# Patient Record
Sex: Female | Born: 1935 | Race: Black or African American | Hispanic: No | Marital: Single | Smoking: Former smoker
Health system: Southern US, Community
[De-identification: ages and names within clinical notes are randomized; demographics above are authoritative.]

## PROBLEM LIST (undated history)

## (undated) DIAGNOSIS — I10 Essential (primary) hypertension: Secondary | ICD-10-CM

## (undated) DIAGNOSIS — M109 Gout, unspecified: Secondary | ICD-10-CM

## (undated) DIAGNOSIS — I509 Heart failure, unspecified: Secondary | ICD-10-CM

## (undated) DIAGNOSIS — F411 Generalized anxiety disorder: Secondary | ICD-10-CM

## (undated) DIAGNOSIS — C3490 Malignant neoplasm of unspecified part of unspecified bronchus or lung: Secondary | ICD-10-CM

## (undated) DIAGNOSIS — D649 Anemia, unspecified: Secondary | ICD-10-CM

## (undated) DIAGNOSIS — I739 Peripheral vascular disease, unspecified: Secondary | ICD-10-CM

## (undated) DIAGNOSIS — K219 Gastro-esophageal reflux disease without esophagitis: Secondary | ICD-10-CM

## (undated) DIAGNOSIS — H409 Unspecified glaucoma: Secondary | ICD-10-CM

## (undated) DIAGNOSIS — R1310 Dysphagia, unspecified: Secondary | ICD-10-CM

## (undated) HISTORY — PX: OTHER SURGICAL HISTORY: SHX169

## (undated) HISTORY — DX: Malignant neoplasm of unspecified part of unspecified bronchus or lung: C34.90

---

## 2009-03-23 ENCOUNTER — Inpatient Hospital Stay (HOSPITAL_COMMUNITY): Admission: RE | Admit: 2009-03-23 | Discharge: 2009-03-27 | Payer: Self-pay | Admitting: Neurological Surgery

## 2009-03-26 ENCOUNTER — Ambulatory Visit: Payer: Self-pay | Admitting: Physical Medicine & Rehabilitation

## 2009-03-27 ENCOUNTER — Inpatient Hospital Stay (HOSPITAL_COMMUNITY)
Admission: RE | Admit: 2009-03-27 | Discharge: 2009-04-04 | Payer: Self-pay | Admitting: Physical Medicine & Rehabilitation

## 2009-03-31 ENCOUNTER — Ambulatory Visit: Payer: Self-pay | Admitting: Physical Medicine & Rehabilitation

## 2009-05-14 ENCOUNTER — Encounter: Admission: RE | Admit: 2009-05-14 | Discharge: 2009-05-14 | Payer: Self-pay | Admitting: Neurological Surgery

## 2009-05-22 ENCOUNTER — Encounter: Admission: RE | Admit: 2009-05-22 | Discharge: 2009-05-22 | Payer: Self-pay | Admitting: Neurological Surgery

## 2009-07-24 ENCOUNTER — Encounter: Admission: RE | Admit: 2009-07-24 | Discharge: 2009-07-24 | Payer: Self-pay | Admitting: Neurological Surgery

## 2009-09-25 ENCOUNTER — Encounter: Admission: RE | Admit: 2009-09-25 | Discharge: 2009-09-25 | Payer: Self-pay | Admitting: Neurological Surgery

## 2011-02-03 LAB — CBC
HCT: 23.4 % — ABNORMAL LOW (ref 36.0–46.0)
HCT: 26 % — ABNORMAL LOW (ref 36.0–46.0)
Hemoglobin: 8.4 g/dL — ABNORMAL LOW (ref 12.0–15.0)
MCHC: 34 g/dL (ref 30.0–36.0)
MCV: 90.7 fL (ref 78.0–100.0)
Platelets: 170 10*3/uL (ref 150–400)
Platelets: 226 10*3/uL (ref 150–400)
Platelets: 293 10*3/uL (ref 150–400)
RBC: 2.59 MIL/uL — ABNORMAL LOW (ref 3.87–5.11)
RDW: 13.7 % (ref 11.5–15.5)
RDW: 13.9 % (ref 11.5–15.5)
WBC: 4.4 10*3/uL (ref 4.0–10.5)
WBC: 6.7 10*3/uL (ref 4.0–10.5)

## 2011-02-03 LAB — COMPREHENSIVE METABOLIC PANEL
AST: 10 U/L (ref 0–37)
Albumin: 2.3 g/dL — ABNORMAL LOW (ref 3.5–5.2)
Alkaline Phosphatase: 41 U/L (ref 39–117)
CO2: 27 mEq/L (ref 19–32)
Chloride: 104 mEq/L (ref 96–112)
Creatinine, Ser: 1.17 mg/dL (ref 0.4–1.2)
GFR calc Af Amer: 55 mL/min — ABNORMAL LOW (ref >60)
GFR calc non Af Amer: 45 mL/min — ABNORMAL LOW (ref >60)
Potassium: 4.1 mEq/L (ref 3.5–5.1)
Total Bilirubin: 0.4 mg/dL (ref 0.3–1.2)

## 2011-02-03 LAB — HEMOGLOBIN AND HEMATOCRIT, BLOOD
HCT: 26 % — ABNORMAL LOW (ref 36.0–46.0)
Hemoglobin: 8.8 g/dL — ABNORMAL LOW (ref 12.0–15.0)

## 2011-02-03 LAB — DIFFERENTIAL
Basophils Absolute: 0 10*3/uL (ref 0.0–0.1)
Basophils Relative: 0 % (ref 0–1)
Eosinophils Absolute: 0.2 10*3/uL (ref 0.0–0.7)
Eosinophils Relative: 4 % (ref 0–5)
Lymphocytes Relative: 26 % (ref 12–46)
Monocytes Absolute: 0.7 10*3/uL (ref 0.1–1.0)

## 2011-02-03 LAB — SEDIMENTATION RATE: Sed Rate: 73 mm/hr — ABNORMAL HIGH (ref 0–22)

## 2011-02-03 LAB — RHEUMATOID FACTOR: Rhuematoid fact SerPl-aCnc: <20 IU/mL (ref 0–20)

## 2011-02-04 LAB — DIFFERENTIAL
Basophils Relative: 0 % (ref 0–1)
Eosinophils Absolute: 0.2 10*3/uL (ref 0.0–0.7)
Eosinophils Relative: 4 % (ref 0–5)
Lymphs Abs: 1.5 10*3/uL (ref 0.7–4.0)
Monocytes Relative: 8 % (ref 3–12)

## 2011-02-04 LAB — COMPREHENSIVE METABOLIC PANEL
ALT: 12 U/L (ref 0–35)
AST: 18 U/L (ref 0–37)
Alkaline Phosphatase: 61 U/L (ref 39–117)
CO2: 27 mEq/L (ref 19–32)
Calcium: 9.7 mg/dL (ref 8.4–10.5)
GFR calc Af Amer: 55 mL/min — ABNORMAL LOW (ref >60)
GFR calc non Af Amer: 45 mL/min — ABNORMAL LOW (ref >60)
Glucose, Bld: 108 mg/dL — ABNORMAL HIGH (ref 70–99)
Potassium: 4.2 mEq/L (ref 3.5–5.1)
Sodium: 140 mEq/L (ref 135–145)
Total Protein: 6.7 g/dL (ref 6.0–8.3)

## 2011-02-04 LAB — URINALYSIS, ROUTINE W REFLEX MICROSCOPIC
Glucose, UA: NEGATIVE mg/dL
Hgb urine dipstick: NEGATIVE
Specific Gravity, Urine: 1.021 (ref 1.005–1.030)

## 2011-02-04 LAB — CBC
Hemoglobin: 10.2 g/dL — ABNORMAL LOW (ref 12.0–15.0)
MCHC: 33.8 g/dL (ref 30.0–36.0)
RBC: 3.36 MIL/uL — ABNORMAL LOW (ref 3.87–5.11)

## 2011-02-04 LAB — URINE MICROSCOPIC-ADD ON

## 2011-02-04 LAB — URINE CULTURE

## 2011-03-11 NOTE — H&P (Signed)
NAMEFRANCHON, Elizabeth Daniels NO.:  [id number]   MEDICAL RECORD NO.:  [id number]          PATIENT TYPE:  INP   LOCATION:                               FACILITY:  [facility]   PHYSICIAN:  Ranelle Oyster, M.D.DATE OF BIRTH:  [birth year]   DATE OF ADMISSION:  03/27/2009  DATE OF DISCHARGE:                              HISTORY & PHYSICAL   CHIEF COMPLAINT:  Tetraplegia.   PRIMARY CARE PHYSICIAN:  Dr. Nelson Chimes in [place].   NEUROSURGEON:  Tia Alert, MD   HISTORY OF PRESENT ILLNESS:  This is a 77 year old black female admitted  on Mar 23, 2009, with progressive numbness and weakness in her upper  extremities.  MRI showed cervical spondylosis with stenosis myelopathy  at C3-C4 and C4-C5.  She underwent decompression laminectomy with  foraminotomies at these levels on Mar 23, 2009, by Dr. Yetta Barre.  She is  placed in soft collar for comfort.  Pain has been controlled with  Vicodin and Flexeril.  She continues to have significant weakness in all  4, particularly in shoulders bilaterally.  She has had some pain and  spasms as well.  The patient continues to struggle with her mobility and  self-care and was evaluated by rehab on Mar 26, 2009, and felt to be  appropriate candidate and ultimately was admitted today.   REVIEW OF SYSTEMS:  Notable for weakness, numbness, some constipation,  decreased appetite, spasm, and pain.  Other pertinent positives are  above and full reviews in the written H&P.   PAST MEDICAL HISTORY:  Positive for:  1. Hypertension.  2. CAD.  3. Glaucoma.  4. Decreased hearing.  5. Chronic anemia.  6. T&A.  7. Bilateral cataract surgery.  8. Bladder tack.  9. She has remote tobacco and alcohol history.   FAMILY HISTORY:  Positive for CAD.   SOCIAL HISTORY:  The patient lives with daughter and granddaughter.  Daughter has cerebral palsy, but still works.  Granddaughter works days.  She has one-level house with two steps to enter.   ALLERGIES:   QUININE, CODEINE, ASPIRIN.   HOME MEDICATIONS:  Plavix, labetalol, clonidine, Micardis, Norvasc,  Flexeril, Lasix, vitamin D, Caltrate, Enablex.   LABORATORY DATA:  Hemoglobin 10.2, white count 4.8, platelets 203.  Sodium 140, potassium 4.2, BUN 16, creatinine 1.17.   PHYSICAL EXAMINATION:  VITAL SIGNS:  Blood pressure 126/63, pulse 75,  respiratory rate 18, temperature 97.1.  GENERAL:  The patient is pleasant, alert and oriented x3.  She is in a  soft collar today.  HEENT:  Ear, nose, and throat exam is essentially unremarkable with fair  dentition.  Pink moist mucosa.  NECK:  Supple without JVD or lymphadenopathy.  Posterior neck incision  is clean and intact with minimal serosanguineous discharge.  CHEST:  Clear to auscultation bilaterally without wheezes, rales, or  rhonchi.  HEART:  Regular rate and rhythm without murmurs, rubs, or gallops.  ABDOMEN:  Soft, nontender.  Bowel sounds are positive.  SKIN:  As noted above and no other breakdown is seen.  NEUROLOGIC:  Notable for generally intact sensation in the arms and legs  per the  patient exam.  She may have some diminishment in upper  extremities but difficult to truly assess today.  Strength is notably  diminishing in the upper extremities with trace to one on the right  shoulder, 1-2/5 left shoulder, biceps and triceps 3/5, hand intrinsics  2+-3/5.  Lower extremity strength is 2/5 right hip to +3 in right knee,  2+-3/5 right ankle.  Left lower extremities 3/5 to 3+/5 proximal and  distal.  Reflexes are 1+ throughout.  Judgment, orientation, memory, and  mood were all appropriate.   POSTADMISSION PHYSICIAN EVALUATION:  1. Functional deficits secondary to cervical stenosis, myelopathy,      status post decompressive laminectomy and foraminotomies at C3      through C5.  The patient is postoperative day #4.  2. The patient admitted to receive collaborative interdisciplinary      care between the physiatrist, rehab nursing  staff, and therapy      team.  3. The patient's level of medical complexity and substantial therapy      needs in context of that medical necessity cannot be provided at a      lesser intensity of care.  4. The patient has experienced substantial functional loss from her      baseline.  Upon functional assessment at the time of preadmission      screening, the patient was min assist bed mobility, min assist      ambulation 120 feet with a rolling walker.  She had not been      assessed by OT at the time of the eval.  Within the last 12 hours,      the patient has begun ADLs with min assist upper body bathing and      dressing, supervision lower body bathing and dressing.  The patient      was independent prior to arrival and driving.  Judging by the      patient's diagnosis, physical exam, and functional history, she has      the potential for functional progress which will result in      measurable gains while in inpatient rehab.  These gains will be of      substantial and practical use upon discharge to home in      facilitating mobility and self-care.  5. Physiatrist will provide 24-hour management of medical needs as      well as oversight of the therapy plan/treatment and provide      guidance as appropriate regarding interaction of the two.  Medical      problem list and plan are listed below.  6. 24-hour rehab nursing will assist in management of the patient's      skin care needs as well as bowel and bladder, nutrition, medication      administration, safety awareness, and pain control with goals      modified independent.  7. PT will assess and treat for lower extremity strengthening, range      of motion, adaptive equipment, functional mobility, gait,      neuromuscular reeducation with goals modified independent.  8. OT will assess and treat for upper extremity use, adaptive      techniques, equipment, family education, safety awareness, and      goals for OT are  supervision to modified independent.  9. Case management and social worker will assess and treat for      psychosocial issues and discharge planning.  10.Team conferences will be held weekly to assess progress towards  goals and to determine barriers to discharge.  11.The patient has demonstrated sufficient medical stability and      exercise capacity to tolerate at least 3 hours of therapy per day      at least 5 days per week.  12.Estimated length of stay is approximately 1 week.  Prognosis is      good.   MEDICAL PROBLEM LIST AND PLAN:  1. Pain control:  We will change Flexeril to Robaxin as the patient      has hallucinations and excessive fatigue with Flexeril.  We will      use Vicodin for breakthrough pain as well.  2. Hypertension:  Currently controlled with labetalol, clonidine,      Benicar, and Norvasc.  Observe as the patient's mobility increases.  3. CAD:  Plavix.  The patient denies any shortness breath, chest pain      etc.  4. Chronic anemia:  Most recent hemoglobin 10.2.  We will check CBC in      the morning for any acute blood loss.  5. Constipation:  We will augment bowel regimen and encourage appetite      and intake.      Ranelle Oyster, M.D.  Electronically Signed     ZTS/MEDQ  D:  03/27/2009  T:  03/27/2009  Job:  [id number]   cc:   Tia Alert, MD  Dr. Nelson Chimes

## 2011-03-11 NOTE — Op Note (Signed)
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   PROCEDURES:  1. Decompressive cervical laminectomy and medial facetectomy and      foraminotomies C3-4 and C4-5 for decompression of central canal.  2. Posterolateral arthrodesis C3-C5 utilizing locally harvested      morselized autologous bone graft and Actifuse putty.  3. Segmental fixation of C3 to C5 utilizing the vertex lateral mass      system.   SURGEON:  Tia Alert, MD   ASSISTANT:  Kathaleen Maser. Pool, MD   ANESTHESIA:  General endotracheal.   COMPLICATIONS:  None apparent.   INDICATIONS FOR PROCEDURE:  Elizabeth Daniels is a 77 year old female who  presented with progressive numbness and weakness in her hands, weakness  in her shoulder girdle especially on the right, and difficulty with  gait.  She was found to be myelopathic on exam.  She had a very weak  shoulder girdle on the right side.  Hand grips were 4-/5, and her gait  was spastic.  She was hyporeflexic.  She had an MRI which showed  spondylosis with cervical spinal stenosis at C3-4 and C4-5 with no  change in the spinal cord about C4.  I recommended decompressive  cervical laminectomy with lateral mass plating.  She understood the  risks, benefits, expected outcome and wished to proceed.   DESCRIPTION OF THE PROCEDURE:  The patient was taken to the operating  room, and after induction of adequate generalized endotracheal  anesthesia she  was fixed to a three-point [place] headrest in the prone  position.  The posterior cervical region was prepped with DuraPrep and  then draped in the usual sterile fashion.  Local anesthesia 6 mL was  injected, and dorsal midline incision was made and carried down to the  fascia.  The cervical fascia was opened, and the paraspinous musculature  was taken down in a subperiosteal fashion to expose C3, C4, and C5.  I  dissected out over the lateral masses.  Intraoperative fluoroscopy  confirmed my level.  She was very spondylotic in her anatomy, was  distorted.  I localized my screw entry zones 1 mm medial to the midpoint  of the lateral mass.  I drilled in an upward and outward direction with  the hand drill and the drill guide into the safe zone of the lateral  mass, tapped each lateral mass, and then placed 14-mm lateral mass  screws in the lateral mass of C3, C4, and C5, and checked these with  lateral fluoroscopy.  I then removed the spinous processes of C3 and C4  in the top past the  C5 and then used the 2- and 3-mm Kerrison punch to  perform a complete laminectomy at C3, C4, and the top of C5.  Medial  facetectomies were also performed, and medial foraminotomies were  performed at C4-5.  Once my decompression was complete, I could see the  cord pulsatile through the dura.  I then took lordotic rods and placed  them into multiaxial screw heads of the vertex screws and locked them  into position, utilizing the anti-torque device.  We then checked our  construct with AP and lateral fluoroscopy.  I decorticated the lateral  masses as well as the facets and packed these with local autograft and  Actifuse putty.  We then placed a medium Hemovac drain through a  separate stab incision, lined the dura with Gelfoam, closed the muscle  and the fascia with 0 Vicryl closing the subcutaneous and subcuticular  tissue with 2-0 and 3-0 Vicryl and closed the skin with Benzoin and  Steri-Strips.  The  drapes were removed.  Sterile dressing was applied.  The patient was awakened from anesthesia and transferred to the recovery  room stable condition.  At the end of the procedure, all sponge, needle,  and instrument counts were correct.      Tia Alert, MD  Electronically Signed     DSJ/MEDQ  D:  03/23/2009  T:  03/23/2009  Job:  [id number]

## 2011-03-11 NOTE — Discharge Summary (Signed)
NAMECECILA, SATCHER NO.:  [id number]   MEDICAL RECORD NO.:  [id number]          PATIENT TYPE:  IPS   LOCATION:  4008                         FACILITY:  [facility]   PHYSICIAN:  Ranelle Oyster, M.D.DATE OF BIRTH:  06-Sep-1931   DATE OF ADMISSION:  03/27/2009  DATE OF DISCHARGE:  04/04/2009                               DISCHARGE SUMMARY   DISCHARGE DIAGNOSES:  1. Cervical stenosis with myelopathy.  2. Pain management.  3. Hypertension.  4. Coronary artery disease.  5. Chronic anemia.   PROCEDURE:  Status post decompressive laminectomy with foraminotomies,  cervical 3-4 and 4-5 on May 28 by Dr. Marikay Alar.   This is 77 year old female, admitted on May 28 with progressive numbness  and weakness in her hands and shoulders.  MRI and imaging showed  cervical spondylosis with stenosis and myelopathy, cervical 3-4 and 4-5,  underwent decompression laminectomy cervical 3-4 and 4-5 on May 28 by  Dr. Yetta Barre.  Soft cervical collar for comfort.  Pain management with  Vicodin and Flexeril, still some bilateral shoulder girdle weakness.  No  chest pain or shortness of breath.  The patient was seen by Inpatient  Rehab Services and felt to be a good candidate for comprehensive rehab  program.   PAST MEDICAL HISTORY:  See discharge diagnoses.  Remote smoker.  Remote  alcohol.   ALLERGIES:  QUININE, CODEINE, and ASPIRIN.   SOCIAL HISTORY:  Lives with daughter and granddaughter, daughter with  cerebral palsy, but still works, granddaughter works day shifts, 1-level  home, 2 steps to entry.   FUNCTIONAL HISTORY PRIOR TO ADMISSION:  Independent with a cane and  driving.   FUNCTIONAL STATUS UPON ADMISSION TO REHAB SERVICES:  Minimal assist  transfers, minimal assist ambulation 120 feet, minimal assist upper body  bathing and dressing, supervision lower body bathing and dressing.   MEDICATIONS PRIOR TO ADMISSION:  1. Plavix 75 mg daily.  2. Labetalol 200 mg one and half  tablets twice daily.  3. Clonidine 0.2 mg twice daily.  4. Micardis 80 mg daily.  5. Norvasc 5 mg daily.  6. Flexeril 10 mg one-half tablet 3 times daily.  7. Lasix 40 mg twice daily as needed.  8. Vitamin D daily.  9. Caltrate twice daily.  10.Enablex 7.5 mg daily.   PHYSICAL EXAMINATION:  VITAL SIGNS:  Blood pressure 126/63, pulse 75,  temperature 97.1, and respirations 18.  NEUROLOGIC:  This is an alert female, in no acute distress, oriented x3.  Sensation decreased to light touch distally.  Cervical collar in place.  Surgical site clean and dry.  Calves remained cool without swelling or  erythema, nontender.  LUNGS:  Clear to auscultation.  CARDIAC:  Regular rate and rhythm.  ABDOMEN:  Soft and nontender.  Good bowel sounds.   REHABILITATION HOSPITAL COURSE:  The patient was admitted to Inpatient  Rehab Services with therapies initiated on a 3-hour daily basis  consisting of physical therapy, occupational therapy, and rehabilitation  nursing.  The following issues were addressed during the patient's  rehabilitation stay.  Pertaining to Ms. Amor's cervical stenosis  myelopathy, she had undergone decompressive laminectomy  cervical 3-4 and  4-5 on May 28 by Dr. Marikay Alar.  She had a cervical collar in place  for comfort measures.  Pain management ongoing with the use of Vicodin  and Robaxin as needed.  Blood pressures were well controlled on  labetalol, clonidine, Benicar, Norvasc and no orthostatic changes.  She  would follow up with her primary MD, Dr. Nelson Chimes of [place], [place].  She had a history of coronary artery disease.  No stent.  She  was on Plavix prior to admission and this was resumed at discharge.  Chronic anemia remained stable with hemoglobin 8.8.  She was on iron  supplement.  The patient received weekly collaborative interdisciplinary  team conferences to discuss estimated length of stay, family teaching,  any barriers to discharge.  She was up  walking with a rolling walker,  extended household distances, needing some assistance yet for lower body  activities of daily living.  She lives with her daughter, who still  works, her granddaughter also home that worked.  Functionally, she  continued to progress in all areas and home health therapies would be  ongoing to help in managing her at home.  She had no bowel or bladder  disturbances through her rehab stay.  She did have urinalysis study  showed no growth.  She was discharged to home.   DISCHARGE MEDICATIONS:  1. Labetalol 300 mg twice daily.  2. Clonidine 0.2 mg twice daily.  3. Micardis 80 mg daily.  4. Norvasc 5 mg daily.  5. Vitamin D daily.  6. Nu-Iron 150 mg daily.  7. Enablex 7.5 mg daily.  8. Protonix 40 mg daily.  9. Carafate 1 g 3 times daily.  10.Vicodin 5/325 one or two tablets every 4 hours as needed for pain.  11.Robaxin 500 mg every 6 hours as needed for muscle spasms.  12.Plavix 75 mg daily, resumed.   DIET:  No restrictions.   SPECIAL INSTRUCTIONS:  Soft cervical collar for comfort .  Follow up  with Dr. Marikay Alar, Neurosurgery.  Call for appointment Dr. Nelson Chimes,  Medical Management.      Mariam Dollar, P.A.      Ranelle Oyster, M.D.  Electronically Signed    DA/MEDQ  D:  04/03/2009  T:  04/04/2009  Job:  [id number]   cc:   Dr. Bud Face, MD

## 2011-03-14 NOTE — Discharge Summary (Signed)
NAMEASHAYLA, SUBIA NO.:  [id number]   MEDICAL RECORD NO.:  [id number]          PATIENT TYPE:  INP   LOCATION:  3033                         FACILITY:  [facility]   PHYSICIAN:  Tia Alert, MD     DATE OF BIRTH:  May 16, 1931   DATE OF ADMISSION:  03/23/2009  DATE OF DISCHARGE:  04/05/2009                               DISCHARGE SUMMARY   ADMITTING DIAGNOSIS:  Cervical spondylotic myelopathy.   PROCEDURE:  Cervical laminectomy with instrumented fusion.   BRIEF HISTORY OF PRESENT ILLNESS:  Ms. Mutz is a 77 year old  female who presented with numbness in her hands and change in gait.  She  was found to be severely myelopathic.  On exam, she had stenosis at C3-4  and C4-5.  I recommended a posterior cervical decompression and  instrumented fusion.  She understood the risks, benefits, and expected  outcome and wished to proceed.   HOSPITAL COURSE:  The patient was admitted on Mar 23, 2009, and taken to  the operating room where she underwent a cervical laminectomy at C3-4  and C4-5, followed by instrumented fusion at C3-C5.  The patient  tolerated the procedure well and was taken to the recovery room and then  to the floor in stable condition.  For details of the operative  procedure, please see the dictated operative note.  The patient's  hospital course was fairly routine.  There were no complications.  Her  wound remained clean, dry, and intact.  Her Hemovac drain was removed on  postop day 2.  She had no real improvement in myelopathic symptom.  She  had some difficulty with gait and with weakness in her hands and in her  shoulder girdles.  We ordered physical and occupational therapy, we felt  like she was a good candidate for rehabilitation because of her severe  myelopathy.  She was discharged the rehab unit on March 27, 2009, with  plans to follow up with me in 2 weeks after discharge from rehab.   FINAL DIAGNOSIS:  Cervical spondylotic myelopathy,  status post cervical  laminectomy with instrumented fusion.      Tia Alert, MD  Electronically Signed     DSJ/MEDQ  D:  04/27/2009  T:  04/27/2009  Job:  [id number]

## 2015-09-30 ENCOUNTER — Encounter (HOSPITAL_COMMUNITY): Payer: Self-pay | Admitting: Emergency Medicine

## 2015-09-30 ENCOUNTER — Emergency Department (HOSPITAL_COMMUNITY): Payer: Medicare Other

## 2015-09-30 ENCOUNTER — Inpatient Hospital Stay (HOSPITAL_COMMUNITY)
Admission: EM | Admit: 2015-09-30 | Discharge: 2015-10-04 | DRG: 378 | Disposition: A | Discharge status: Skilled Nursing Facility | Payer: Medicare Other | Attending: Internal Medicine | Admitting: Internal Medicine

## 2015-09-30 DIAGNOSIS — N183 Chronic kidney disease, stage 3 (moderate): Secondary | ICD-10-CM | POA: Diagnosis present

## 2015-09-30 DIAGNOSIS — Z89611 Acquired absence of right leg above knee: Secondary | ICD-10-CM

## 2015-09-30 DIAGNOSIS — R55 Syncope and collapse: Secondary | ICD-10-CM | POA: Diagnosis present

## 2015-09-30 DIAGNOSIS — E440 Moderate protein-calorie malnutrition: Secondary | ICD-10-CM | POA: Diagnosis present

## 2015-09-30 DIAGNOSIS — K921 Melena: Secondary | ICD-10-CM | POA: Diagnosis not present

## 2015-09-30 DIAGNOSIS — M109 Gout, unspecified: Secondary | ICD-10-CM | POA: Diagnosis present

## 2015-09-30 DIAGNOSIS — R4189 Other symptoms and signs involving cognitive functions and awareness: Secondary | ICD-10-CM

## 2015-09-30 DIAGNOSIS — I739 Peripheral vascular disease, unspecified: Secondary | ICD-10-CM | POA: Diagnosis present

## 2015-09-30 DIAGNOSIS — D62 Acute posthemorrhagic anemia: Secondary | ICD-10-CM | POA: Diagnosis present

## 2015-09-30 DIAGNOSIS — Z79899 Other long term (current) drug therapy: Secondary | ICD-10-CM

## 2015-09-30 DIAGNOSIS — I10 Essential (primary) hypertension: Secondary | ICD-10-CM | POA: Diagnosis not present

## 2015-09-30 DIAGNOSIS — R001 Bradycardia, unspecified: Secondary | ICD-10-CM | POA: Diagnosis present

## 2015-09-30 DIAGNOSIS — Z9049 Acquired absence of other specified parts of digestive tract: Secondary | ICD-10-CM | POA: Diagnosis not present

## 2015-09-30 DIAGNOSIS — I509 Heart failure, unspecified: Secondary | ICD-10-CM | POA: Diagnosis present

## 2015-09-30 DIAGNOSIS — E861 Hypovolemia: Secondary | ICD-10-CM | POA: Diagnosis present

## 2015-09-30 DIAGNOSIS — Z7902 Long term (current) use of antithrombotics/antiplatelets: Secondary | ICD-10-CM | POA: Diagnosis not present

## 2015-09-30 DIAGNOSIS — Z6820 Body mass index (BMI) 20.0-20.9, adult: Secondary | ICD-10-CM

## 2015-09-30 DIAGNOSIS — I130 Hypertensive heart and chronic kidney disease with heart failure and stage 1 through stage 4 chronic kidney disease, or unspecified chronic kidney disease: Secondary | ICD-10-CM | POA: Diagnosis present

## 2015-09-30 DIAGNOSIS — R1310 Dysphagia, unspecified: Secondary | ICD-10-CM | POA: Diagnosis present

## 2015-09-30 DIAGNOSIS — K922 Gastrointestinal hemorrhage, unspecified: Secondary | ICD-10-CM | POA: Diagnosis present

## 2015-09-30 DIAGNOSIS — R579 Shock, unspecified: Secondary | ICD-10-CM | POA: Diagnosis not present

## 2015-09-30 DIAGNOSIS — F411 Generalized anxiety disorder: Secondary | ICD-10-CM | POA: Diagnosis present

## 2015-09-30 DIAGNOSIS — R933 Abnormal findings on diagnostic imaging of other parts of digestive tract: Secondary | ICD-10-CM | POA: Diagnosis not present

## 2015-09-30 DIAGNOSIS — N189 Chronic kidney disease, unspecified: Secondary | ICD-10-CM

## 2015-09-30 DIAGNOSIS — Z87891 Personal history of nicotine dependence: Secondary | ICD-10-CM | POA: Diagnosis not present

## 2015-09-30 DIAGNOSIS — H409 Unspecified glaucoma: Secondary | ICD-10-CM | POA: Diagnosis present

## 2015-09-30 DIAGNOSIS — K625 Hemorrhage of anus and rectum: Secondary | ICD-10-CM

## 2015-09-30 DIAGNOSIS — K5731 Diverticulosis of large intestine without perforation or abscess with bleeding: Secondary | ICD-10-CM | POA: Diagnosis present

## 2015-09-30 DIAGNOSIS — K219 Gastro-esophageal reflux disease without esophagitis: Secondary | ICD-10-CM | POA: Diagnosis present

## 2015-09-30 DIAGNOSIS — N179 Acute kidney failure, unspecified: Secondary | ICD-10-CM | POA: Diagnosis present

## 2015-09-30 HISTORY — DX: Heart failure, unspecified: I50.9

## 2015-09-30 HISTORY — DX: Gastro-esophageal reflux disease without esophagitis: K21.9

## 2015-09-30 HISTORY — DX: Generalized anxiety disorder: F41.1

## 2015-09-30 HISTORY — DX: Dysphagia, unspecified: R13.10

## 2015-09-30 HISTORY — DX: Peripheral vascular disease, unspecified: I73.9

## 2015-09-30 HISTORY — DX: Essential (primary) hypertension: I10

## 2015-09-30 HISTORY — DX: Anemia, unspecified: D64.9

## 2015-09-30 HISTORY — DX: Unspecified glaucoma: H40.9

## 2015-09-30 HISTORY — DX: Gout, unspecified: M10.9

## 2015-09-30 LAB — PREPARE RBC (CROSSMATCH)

## 2015-09-30 LAB — HEMOGLOBIN AND HEMATOCRIT, BLOOD
HEMATOCRIT: 35 % — AB (ref 36.0–46.0)
HEMATOCRIT: 32.6 % — AB (ref 36.0–46.0)
HEMOGLOBIN: 10.8 g/dL — AB (ref 12.0–15.0)
Hemoglobin: 11.6 g/dL — ABNORMAL LOW (ref 12.0–15.0)

## 2015-09-30 LAB — COMPREHENSIVE METABOLIC PANEL
ALT: 6 U/L — AB (ref 14–54)
AST: 11 U/L — AB (ref 15–41)
Albumin: 2.8 g/dL — ABNORMAL LOW (ref 3.5–5.0)
Alkaline Phosphatase: 57 U/L (ref 38–126)
Anion gap: 7 (ref 5–15)
BUN: 54 mg/dL — AB (ref 6–20)
CHLORIDE: 113 mmol/L — AB (ref 101–111)
CO2: 22 mmol/L (ref 22–32)
CREATININE: 1.55 mg/dL — AB (ref 0.44–1.00)
Calcium: 9.5 mg/dL (ref 8.9–10.3)
GFR calc Af Amer: 35 mL/min — ABNORMAL LOW (ref >60)
GFR, EST NON AFRICAN AMERICAN: 30 mL/min — AB (ref >60)
GLUCOSE: 95 mg/dL (ref 65–99)
Potassium: 4 mmol/L (ref 3.5–5.1)
Sodium: 142 mmol/L (ref 135–145)
Total Bilirubin: 0.4 mg/dL (ref 0.3–1.2)
Total Protein: 6 g/dL — ABNORMAL LOW (ref 6.5–8.1)

## 2015-09-30 LAB — CBC WITH DIFFERENTIAL/PLATELET
Basophils Absolute: 0 10*3/uL (ref 0.0–0.1)
Basophils Relative: 0 %
EOS ABS: 0.1 10*3/uL (ref 0.0–0.7)
EOS PCT: 3 %
HCT: 30.5 % — ABNORMAL LOW (ref 36.0–46.0)
Hemoglobin: 9.7 g/dL — ABNORMAL LOW (ref 12.0–15.0)
LYMPHS ABS: 1 10*3/uL (ref 0.7–4.0)
Lymphocytes Relative: 25 %
MCH: 32.2 pg (ref 26.0–34.0)
MCHC: 31.8 g/dL (ref 30.0–36.0)
MCV: 101.3 fL — ABNORMAL HIGH (ref 78.0–100.0)
MONO ABS: 0.3 10*3/uL (ref 0.1–1.0)
MONOS PCT: 7 %
Neutro Abs: 2.6 10*3/uL (ref 1.7–7.7)
Neutrophils Relative %: 65 %
PLATELETS: 200 10*3/uL (ref 150–400)
RBC: 3.01 MIL/uL — AB (ref 3.87–5.11)
RDW: 15 % (ref 11.5–15.5)
WBC: 3.9 10*3/uL — AB (ref 4.0–10.5)

## 2015-09-30 LAB — MRSA PCR SCREENING: MRSA by PCR: POSITIVE — AB

## 2015-09-30 LAB — ABO/RH: ABO/RH(D): O POS

## 2015-09-30 LAB — PROTIME-INR
INR: 1.16 (ref 0.00–1.49)
Prothrombin Time: 15 seconds (ref 11.6–15.2)

## 2015-09-30 LAB — APTT: APTT: 33 s (ref 24–37)

## 2015-09-30 MED ORDER — IOHEXOL 300 MG/ML  SOLN: 80.0000 mL | Freq: Once | INTRAMUSCULAR | Status: DC | PRN

## 2015-09-30 MED ORDER — IOHEXOL 300 MG/ML  SOLN
80.0000 mL | Freq: Once | INTRAMUSCULAR | Status: AC | PRN
  Administered 2015-09-30: 80 mL via INTRAVENOUS

## 2015-09-30 MED ORDER — NICARDIPINE HCL IN NACL 20-0.86 MG/200ML-% IV SOLN
3.0000 mg/h | INTRAVENOUS | Status: DC
  Administered 2015-09-30: 5.5 mg/h via INTRAVENOUS
  Administered 2015-09-30: 5 mg/h via INTRAVENOUS
  Filled 2015-09-30 (×5): qty 200

## 2015-09-30 MED ORDER — SODIUM CHLORIDE 0.9 % IV SOLN
10.0000 mL/h | Freq: Once | INTRAVENOUS | Status: AC
  Administered 2015-09-30: 10 mL/h via INTRAVENOUS

## 2015-09-30 MED ORDER — IOHEXOL 300 MG/ML  SOLN
50.0000 mL | Freq: Once | INTRAMUSCULAR | Status: AC | PRN
  Administered 2015-09-30: 50 mL via ORAL

## 2015-09-30 MED ORDER — SODIUM CHLORIDE 0.9 % IV SOLN: INTRAVENOUS | Status: DC

## 2015-09-30 MED ORDER — CHLORHEXIDINE GLUCONATE CLOTH 2 % EX PADS: 6.0000 | MEDICATED_PAD | Freq: Every day | CUTANEOUS | Status: DC

## 2015-09-30 MED ORDER — AMLODIPINE BESYLATE 5 MG PO TABS
5.0000 mg | ORAL_TABLET | Freq: Every day | ORAL | Status: DC
  Administered 2015-09-30: 5 mg via ORAL
  Filled 2015-09-30: qty 1

## 2015-09-30 MED ORDER — SODIUM CHLORIDE 0.9 % IV SOLN
80.0000 mg | Freq: Once | INTRAVENOUS | Status: AC
  Administered 2015-09-30: 80 mg via INTRAVENOUS
  Filled 2015-09-30: qty 80

## 2015-09-30 MED ORDER — MUPIROCIN 2 % EX OINT
1.0000 "application " | TOPICAL_OINTMENT | Freq: Two times a day (BID) | CUTANEOUS | Status: DC
  Administered 2015-09-30 – 2015-10-04 (×8): 1 via NASAL
  Filled 2015-09-30: qty 22

## 2015-09-30 MED ORDER — SODIUM CHLORIDE 0.9 % IV SOLN
INTRAVENOUS | Status: DC
  Administered 2015-09-30: 09:00:00 via INTRAVENOUS

## 2015-09-30 MED ORDER — LIP MEDEX EX OINT
TOPICAL_OINTMENT | CUTANEOUS | Status: AC
  Administered 2015-09-30: 20:00:00
  Filled 2015-09-30: qty 7

## 2015-09-30 MED ORDER — SODIUM CHLORIDE 0.9 % IV SOLN
8.0000 mg/h | INTRAVENOUS | Status: AC
  Administered 2015-09-30 – 2015-10-03 (×7): 8 mg/h via INTRAVENOUS
  Filled 2015-09-30 (×13): qty 80

## 2015-09-30 MED ORDER — PANTOPRAZOLE SODIUM 40 MG IV SOLR: 40.0000 mg | Freq: Two times a day (BID) | INTRAVENOUS | Status: DC

## 2015-09-30 MED ORDER — CETYLPYRIDINIUM CHLORIDE 0.05 % MT LIQD
7.0000 mL | Freq: Two times a day (BID) | OROMUCOSAL | Status: DC
  Administered 2015-09-30 – 2015-10-04 (×8): 7 mL via OROMUCOSAL

## 2015-09-30 NOTE — ED Notes (Signed)
This NT made 2 attempts to access blood, no success

## 2015-09-30 NOTE — ED Provider Notes (Signed)
CSN: [id number]     Arrival date & time 09/30/15  5625 History   First MD Initiated Contact with Patient 09/30/15 0701     Chief Complaint  Patient presents with  . Rectal Bleeding    HPI  This patient p/w rectal bleeding. Onset was yesterday. Since onset there has been intermittent bleeding, not associated with bowel movements.  The blood is red. Though she denies abdominal pain, there is TTP (see PE). No recent med changes, diet changes. Since onset episodes have been occuring without clear precipitant, and there doesn't seem to be a clear alleviating or exacerbating factor. No new CP / syncope / dyspnea. No recent weight loss. Hx of appendectomy  Past Medical History  Diagnosis Date  . Hypertension   . Anemia    History reviewed. No pertinent past surgical history. History reviewed. No pertinent family history. Social History  Substance Use Topics  . Smoking status: Never Smoker   . Smokeless tobacco: None  . Alcohol Use: No   OB History    No data available     Review of Systems  Constitutional:       Per HPI, otherwise negative  HENT:       Per HPI, otherwise negative  Respiratory:       Per HPI, otherwise negative  Cardiovascular:       Per HPI, otherwise negative  Gastrointestinal: Positive for anal bleeding. Negative for vomiting, blood in stool and rectal pain.  Endocrine:       Negative aside from HPI  Genitourinary:       Neg aside from HPI   Musculoskeletal:       Per HPI, otherwise negative  Skin: Negative.   Neurological: Negative for syncope.      Allergies  Codeine  Home Medications   Prior to Admission medications   Not on File   BP 182/67 mmHg  Pulse 78  Temp(Src) 98 F (36.7 C) (Oral)  Resp 20  SpO2 98% Physical Exam  Constitutional: She is oriented to person, place, and time. She has a sickly appearance.  HENT:  Head: Normocephalic and atraumatic.  Eyes: Conjunctivae and EOM are normal.  Cardiovascular: Normal rate and  regular rhythm.   Pulmonary/Chest: Effort normal and breath sounds normal. No stridor. No respiratory distress.  Abdominal: She exhibits no distension. There is tenderness.  Diffuse mild ttp  Genitourinary:  Huge amount of melenotic stool  Musculoskeletal: She exhibits no edema.  R AKA  Neurological: She is alert and oriented to person, place, and time. No cranial nerve deficit.  Skin: Skin is warm and dry.  Psychiatric: She has a normal mood and affect.  Nursing note and vitals reviewed.   ED Course  Procedures (including critical care time) Labs Review Labs Reviewed  COMPREHENSIVE METABOLIC PANEL - Abnormal; Notable for the following:    Chloride 113 (*)    BUN 54 (*)    Creatinine, Ser 1.55 (*)    Total Protein 6.0 (*)    Albumin 2.8 (*)    AST 11 (*)    ALT 6 (*)    GFR calc non Af Amer 30 (*)    GFR calc Af Amer 35 (*)    All other components within normal limits  CBC WITH DIFFERENTIAL/PLATELET - Abnormal; Notable for the following:    WBC 3.9 (*)    RBC 3.01 (*)    Hemoglobin 9.7 (*)    HCT 30.5 (*)    MCV 101.3 (*)  All other components within normal limits  PROTIME-INR  APTT  TYPE AND SCREEN  PREPARE RBC (CROSSMATCH)  ABO/RH  Hgb typical, but with increased BUN,there is e/o hemoconcentration.   Imaging Review Ct Abdomen Pelvis W Contrast  09/30/2015  CLINICAL DATA:  Abdominal pain and rectal bleeding. EXAM: CT ABDOMEN AND PELVIS WITH CONTRAST TECHNIQUE: Multidetector CT imaging of the abdomen and pelvis was performed using the standard protocol following bolus administration of intravenous contrast. CONTRAST:  68m OMNIPAQUE IOHEXOL 300 MG/ML SOLN, 815mOMNIPAQUE IOHEXOL 300 MG/ML SOLN COMPARISON:  None. FINDINGS: Lower chest: There are two 4 mm left lower lobe pulmonary nodules (series 4/images 1 and 2). Mild cardiomegaly. Right coronary atherosclerosis. Hepatobiliary: Normal liver with no liver mass. Gallbladder contains a gas containing partially calcified  2.3 cm gallstone. Likely additional layering tiny subcentimeter gallstones. No gallbladder wall thickening or pericholecystic fat stranding. No biliary ductal dilatation. Pancreas: Normal, with no mass or duct dilation. Spleen: Normal size. No mass. Adrenals/Urinary Tract: There is a 1.1 cm hypodense right adrenal nodule (series 5/ image 45), with indeterminate density. Normal left adrenal. No hydronephrosis. There are 3 small simple right renal cysts measuring up to 1.4 cm in size. There are several subcentimeter hypodense lesions in both kidneys, which are too small to characterize. Normal bladder. Stomach/Bowel: Small hiatal hernia. Otherwise grossly normal stomach. There is a small to moderate left inguinal hernia containing small bowel loops, with no small bowel wall thickening, focal small bowel caliber transition or small bowel pneumatosis. Normal caliber small bowel with no small bowel wall thickening. Appendix is not discretely visualized. There is circumferential wall thickening and mucosal hyperenhancement throughout the cecum and ascending colon, with associated pericolonic fat stranding and ill-defined fluid. There is mild sigmoid diverticulosis, with no colonic wall thickening or pericolonic fat stranding in association with the sigmoid diverticula. There is circumferential wall thickening and hyper enhancement with mild surrounding fat stranding at the anorectal junction. Vascular/Lymphatic: Atherosclerotic nonaneurysmal abdominal aorta. Patent portal, splenic, hepatic and renal veins. No pathologically enlarged lymph nodes in the abdomen or pelvis. Reproductive: Grossly normal uterus.  No adnexal mass. Other: No pneumoperitoneum. Trace free fluid in the pelvis. Small fat containing umbilical hernia. Small fat containing right inguinal hernia. Musculoskeletal: Marked degenerative changes in the visualized thoracolumbar spine. No aggressive appearing focal osseous lesions. IMPRESSION: 1.  Circumferential wall thickening, mucosal hyperenhancement and pericolonic fat stranding in the cecum and ascending colon. Circumferential wall thickening and wall hyper enhancement at the anorectal junction. These are nonspecific findings that could represent an infectious or inflammatory proctocolitis, with the differential including C diff colitis, although an underlying neoplasm cannot be excluded and correlation with colonoscopy is advised when clinically feasible if not recently performed. 2. Mild sigmoid diverticulosis. No colonic wall thickening in association with the areas of diverticulosis. 3. No evidence of bowel perforation or abscess. 4. Small moderate left inguinal hernia containing small bowel loops, with no evidence of small-bowel obstruction or ischemia. Small fat containing umbilical and right inguinal hernias. 5. Two left lower lobe pulmonary nodules, largest 4 mm. If the patient is at high risk for bronchogenic carcinoma, follow-up chest CT at 1 year is recommended. If the patient is at low risk, no follow-up is needed. This recommendation follows the consensus statement: Guidelines for Management of Small Pulmonary Nodules Detected on CT Scans: A Statement from the [employer] published in Radiology 2005; 237:395-400. 6. Cholelithiasis. 7. Small indeterminate right adrenal nodule, probably a benign adenoma. Correlate with follow-up unenhanced CT abdomen  or MRI abdomen in 12 months. Electronically Signed   By: Ilona Sorrel M.D.   On: 09/30/2015 09:40   I have personally reviewed and evaluated these images and lab results as part of my medical decision-making.    8:39 AM Patient went unresponsive.  She passed another large amount of bloody stool. With supplemental oxygen the patient continued to have oxygen saturation of 100%, though requiring blow-by oxygen. Patient was nonresponsive, even to pain. There were thready palpable pulses in the popliteal fossa, left. Patient  previously had received fluid resuscitation, and additional IV access was obtained, patient received 2 L IV fluid wide open. Cardio monitor shows bradycardia, 40-50 w bigeminy  Patient previously had a type and screen sent, blood transfusion expedited. Crash cart available After several moments of continued fluid, supplemental oxygen, the patient went to awaken.  Update: Patient now awake, acknowledges a report of what just happened to her. Patient states that she would like full resuscitation measures. She denies a history of prior GI bleed. Cardiac rhythm now SR, 70-80  Update:, Labs notable for hemoglobin 9.7, consistent with prior values, suggesting acute GI bleed.  11:01 AM Patient now awake, interacting at baseline. VS 150/75 Blood running I attempted to contact family, but the numbers are nor operational.  MDM  Elderly female presents from nursing facility with concern for rectal bleeding. Here, the patient has obvious melanotic stool, and after the initial evaluation, while receiving initial resuscitative fluids, patient had acute decompensation, with loss of consciousness, bradycardia. Patient had resuscitation with additional fluids, oxygen, and subsequently with blood transfusion. Patient returned to full interactivity, and vital signs normalized.  I discussed patient's case with our gastroenterology colleagues, critical care colleagues, and the patient was admitted to the ICU.  CRITICAL CARE Performed by: Carmin Muskrat Total critical care time: 45 minutes Critical care time was exclusive of separately billable procedures and treating other patients. Critical care was necessary to treat or prevent imminent or life-threatening deterioration. Critical care was time spent personally by me on the following activities: development of treatment plan with patient and/or surrogate as well as nursing, discussions with consultants, evaluation of patient's response to treatment,  examination of patient, obtaining history from patient or surrogate, ordering and performing treatments and interventions, ordering and review of laboratory studies, ordering and review of radiographic studies, pulse oximetry and re-evaluation of patient's condition.   Carmin Muskrat, MD 09/30/15 [phone]

## 2015-09-30 NOTE — ED Notes (Signed)
She remains in no distress; and has just fielded a phone call from her son, with which I assisted her. Monitor shows nsr with 1st degree a-v block without ectopy.

## 2015-09-30 NOTE — ED Notes (Signed)
She remains in no distress.  ICU C.N. Is to call me shortly to let me know when to take pt. Up.  With pt. Permission, I give her daughter, Elizabeth Daniels and update on her condition.

## 2015-09-30 NOTE — Consult Note (Signed)
Referring Provider: EDP, Dr. Vanita Panda Primary Care Physician:  No primary care provider on file. Primary Gastroenterologist:  Althia Forts  Reason for Consultation:  GI bleed  HPI: Elizabeth Daniels is a 81 y.o. female who presented to [facility] hospital this morning from living facility with rectal bleeding.  Large amounts of red blood with small clots.  Started this morning, maybe had some last night but patient did not look at stool.  Did not have bleeding yesterday.  Denies abdominal pain or nausea/vomiting and other GI symptoms.  Unsure if she has ever had colonoscopy, but if so then it was several years ago.  Patient is on Plavix.  CT scan abdomen and pelvis with contrast was ordered and is pending.  Hgb 9.7 grams but we do no have any comparison except for 6 years ago (it was even lower at that time).  BUN elevated at 54.   Past Medical History  Diagnosis Date  . Hypertension   . Anemia    History reviewed. No pertinent past surgical history.  Prior to Admission medications   Medication Sig Start Date End Date Taking? Authorizing Provider  allopurinol (ZYLOPRIM) 100 MG tablet Take 100 mg by mouth daily.   Yes Historical Provider, MD  Amino Acids-Protein Hydrolys (FEEDING SUPPLEMENT, PRO-STAT SUGAR FREE 64,) LIQD Take 30 mLs by mouth 2 (two) times daily.   Yes Historical Provider, MD  cholecalciferol (VITAMIN D) 1000 UNITS tablet Take 1,000 Units by mouth daily.   Yes Historical Provider, MD  clopidogrel (PLAVIX) 75 MG tablet Take 75 mg by mouth daily.   Yes Historical Provider, MD  Dexlansoprazole (DEXILANT) 30 MG capsule Take 30 mg by mouth daily.   Yes Historical Provider, MD  dorzolamide-timolol (COSOPT) 22.3-6.8 MG/ML ophthalmic solution Place 1 drop into both eyes 2 (two) times daily.   Yes Historical Provider, MD  epoetin alfa (EPOGEN,PROCRIT) 99833 UNIT/ML injection Inject 10,000 Units into the skin once a week.   Yes Historical Provider, MD  gabapentin (NEURONTIN) 100 MG  capsule Take 100 mg by mouth 2 (two) times daily.   Yes Historical Provider, MD  gabapentin (NEURONTIN) 300 MG capsule Take 300 mg by mouth 3 (three) times daily.   Yes Historical Provider, MD  hydrALAZINE (APRESOLINE) 50 MG tablet Take 50 mg by mouth 3 (three) times daily.   Yes Historical Provider, MD  HYDROcodone-acetaminophen (NORCO/VICODIN) 5-325 MG tablet Take 1 tablet by mouth every 6 (six) hours as needed for moderate pain.   Yes Historical Provider, MD  iron polysaccharides (NIFEREX) 150 MG capsule Take 150 mg by mouth daily.   Yes Historical Provider, MD  labetalol (NORMODYNE) 300 MG tablet Take 300 mg by mouth 3 (three) times daily.   Yes Historical Provider, MD  latanoprost (XALATAN) 0.005 % ophthalmic solution 1 drop at bedtime.   Yes Historical Provider, MD  mirtazapine (REMERON) 7.5 MG tablet Take 7.5 mg by mouth at bedtime.   Yes Historical Provider, MD  polyethylene glycol (MIRALAX / GLYCOLAX) packet Take 17 g by mouth daily.   Yes Historical Provider, MD  polyvinyl alcohol (LIQUIFILM TEARS) 1.4 % ophthalmic solution Place 1 drop into both eyes as needed for dry eyes.   Yes Historical Provider, MD  promethazine (PHENERGAN) 12.5 MG tablet Take 12.5 mg by mouth every 6 (six) hours as needed for nausea or vomiting.   Yes Historical Provider, MD  torsemide (DEMADEX) 10 MG tablet Take 10 mg by mouth daily.   Yes Historical Provider, MD    Current Facility-Administered  Medications  Medication Dose Route Frequency Provider Last Rate Last Dose  . 0.9 %  sodium chloride infusion   Intravenous Continuous Carmin Muskrat, MD 999 mL/hr at 09/30/15 [phone]    . iohexol (OMNIPAQUE) 300 MG/ML solution 80 mL  80 mL Intravenous Once PRN Carmin Muskrat, MD       Current Outpatient Prescriptions  Medication Sig Dispense Refill  . allopurinol (ZYLOPRIM) 100 MG tablet Take 100 mg by mouth daily.    . Amino Acids-Protein Hydrolys (FEEDING SUPPLEMENT, PRO-STAT SUGAR FREE 64,) LIQD Take 30 mLs by mouth  2 (two) times daily.    . cholecalciferol (VITAMIN D) 1000 UNITS tablet Take 1,000 Units by mouth daily.    . clopidogrel (PLAVIX) 75 MG tablet Take 75 mg by mouth daily.    Marland Kitchen Dexlansoprazole (DEXILANT) 30 MG capsule Take 30 mg by mouth daily.    . dorzolamide-timolol (COSOPT) 22.3-6.8 MG/ML ophthalmic solution Place 1 drop into both eyes 2 (two) times daily.    Marland Kitchen epoetin alfa (EPOGEN,PROCRIT) 20254 UNIT/ML injection Inject 10,000 Units into the skin once a week.    . gabapentin (NEURONTIN) 100 MG capsule Take 100 mg by mouth 2 (two) times daily.    Marland Kitchen gabapentin (NEURONTIN) 300 MG capsule Take 300 mg by mouth 3 (three) times daily.    . hydrALAZINE (APRESOLINE) 50 MG tablet Take 50 mg by mouth 3 (three) times daily.    Marland Kitchen HYDROcodone-acetaminophen (NORCO/VICODIN) 5-325 MG tablet Take 1 tablet by mouth every 6 (six) hours as needed for moderate pain.    . iron polysaccharides (NIFEREX) 150 MG capsule Take 150 mg by mouth daily.    Marland Kitchen labetalol (NORMODYNE) 300 MG tablet Take 300 mg by mouth 3 (three) times daily.    Marland Kitchen latanoprost (XALATAN) 0.005 % ophthalmic solution 1 drop at bedtime.    . mirtazapine (REMERON) 7.5 MG tablet Take 7.5 mg by mouth at bedtime.    . polyethylene glycol (MIRALAX / GLYCOLAX) packet Take 17 g by mouth daily.    . polyvinyl alcohol (LIQUIFILM TEARS) 1.4 % ophthalmic solution Place 1 drop into both eyes as needed for dry eyes.    . promethazine (PHENERGAN) 12.5 MG tablet Take 12.5 mg by mouth every 6 (six) hours as needed for nausea or vomiting.    . torsemide (DEMADEX) 10 MG tablet Take 10 mg by mouth daily.      Allergies as of 09/30/2015 - Review Complete 09/30/2015  Allergen Reaction Noted  . Codeine  09/30/2015  . Quinine derivatives  09/30/2015    History reviewed. No pertinent family history.  Social History   Social History  . Marital Status: Single    Spouse Name: N/A  . Number of Children: N/A  . Years of Education: N/A   Occupational History  .  Not on file.   Social History Main Topics  . Smoking status: Never Smoker   . Smokeless tobacco: Not on file  . Alcohol Use: No  . Drug Use: Not on file  . Sexual Activity: Not on file   Other Topics Concern  . Not on file   Social History Narrative  . No narrative on file    Physical Exam: Vital signs in last 24 hours: Temp:  [98 F (36.7 C)] 98 F (36.7 C) (12/04 0701) Pulse Rate:  [50-82] 82 (12/04 0848) Resp:  [20] 20 (12/04 0701) BP: (171-184)/(55-72) 171/72 mmHg (12/04 0848) SpO2:  [98 %-100 %] 99 % (12/04 0901) FiO2 (%):  [100 %] 100 % (  12/04 0848)   General:  Alert, pleasant and cooperative in NAD Head:  Normocephalic and atraumatic. Eyes:  Sclera clear, no icterus.  Conjunctiva pink. Ears:  Normal auditory acuity. Mouth:  No deformity or lesions.   Lungs:  Clear throughout to auscultation.  No wheezes, crackles, or rhonchi.  Heart:  Regular rate and rhythm. Abdomen:  Soft, non-distended.  BS present.  Non-tender. Rectal:  Not performed but large amounts of bed blood on bed sheets and pads when nursing staff was changing the patient.  Msk:  Symmetrical without gross deformities. Pulses:  Normal pulses noted. Extremities:  Right AKA. Neurologic:  Alert and  oriented x 4;  grossly normal neurologically. Skin:  Intact without significant lesions or rashes. Psych:  Alert and cooperative. Normal mood and affect.  Lab Results:  Recent Labs  09/30/15 0803  WBC 3.9*  HGB 9.7*  HCT 30.5*  PLT 200   BMET  Recent Labs  09/30/15 0803  NA 142  K 4.0  CL 113*  CO2 22  GLUCOSE 95  BUN 54*  CREATININE 1.55*  CALCIUM 9.5   LFT  Recent Labs  09/30/15 0803  PROT 6.0*  ALBUMIN 2.8*  AST 11*  ALT 6*  ALKPHOS 57  BILITOT 0.4   IMPRESSION:  -GI bleed:  Seems lower like diverticular except that BUN is up.  Hgb low but do not have anything for comparison since 6 years ago (was even lower at that time).  PT/INR and PTT pending.  Has been on  Plavix.  PLAN: -Being admitted to PCCM.  Will discuss with Dr. Ardis Hughs regarding endoscopic procedures.  Just had CT scan so those results are pending; will review those when available. -PPI gtt for now.  ZEHR, JESSICA D.  09/30/2015, 9:09 AM  Pager number [phone]    ________________________________________________________________________  Velora Heckler GI MD note:  I personally examined the patient, reviewed the data and agree with the assessment and plan described above. CT reviewed.  She does have diverticulosis in sigmoid.  Also circumferential wall thickening in cecum/ascending as well as anorectal junction.  She will eventually need colonoscopy (probably this admission).  Acute bleeding is painless, seems most likely lower GI.  Will observe clinically for now, IV ppi to be safe.  If eventual colonoscopy shows no clear source for bleeding then EGD at same time.  Plavix last dose was yesterday, washout usually takes 4-5 days.   Owens Loffler, MD [facility] Gastroenterology Pager [phone]

## 2015-09-30 NOTE — ED Notes (Signed)
0825 hours:  As our techs. Were cleaning her bottom (she had just passed a bloody thick liquid stool of ~ 200 ml in volume); pt. Became unresponsive, diaphoretic, with shallow respirations.  As I entered her room I began to give assist respirations with bag/valve mask and observed pt. To have a brady rhythm sinus brady with every other beat being a junctional escape beat.  She always maintained a weak pulse.  Dr. Vanita Panda entered almost immediately; and we established two additional IV's.  In ~6 minutes, pt. Again became responsive and answered questions appropriately and asked about  The whereabouts of her son.  The monitor at this time showed sinus rhythm with occasional unifocal pvc's.  Pt. Denies any pain, but c/o continuing sensation of passing liquid stool.  We defer turning her or cleaning her any more at this time until she is further stabilized.

## 2015-09-30 NOTE — H&P (Signed)
PULMONARY / CRITICAL CARE MEDICINE   Name: Elizabeth Daniels MRN: [id number] DOB: [birth year]    ADMISSION DATE:  09/30/2015 CONSULTATION DATE:  09/30/2015  REFERRING MD:  EDP  CHIEF COMPLAINT:  GI bleed and LOC  HISTORY OF PRESENT ILLNESS:   81 year old female with PMH of HTN and diverticulosis who presents to the hospital with GI bleeding.  While in the ED patient had a syncopal episode, likely vagal and was unresponsive but improved quickly.  Starting noticing blood earlier today in her stool.  While in the hospital had another episode of bloody stool that caused her to become syncopal that recovered within 10 minutes.  PAST MEDICAL HISTORY :  She  has a past medical history of Hypertension and Anemia.  PAST SURGICAL HISTORY: She  has no past surgical history on file.  Allergies  Allergen Reactions  . Codeine     MAR  . Quinine Derivatives     MAR    No current facility-administered medications on file prior to encounter.   No current outpatient prescriptions on file prior to encounter.    FAMILY HISTORY:  Her has no family status information on file.   SOCIAL HISTORY: She  reports that she has never smoked. She does not have any smokeless tobacco history on file. She reports that she does not drink alcohol.  REVIEW OF SYSTEMS:   12 point ROS is negative other than above.  SUBJECTIVE:  Feels well, only complaint is rectal bleeding.  VITAL SIGNS: BP 149/74 mmHg  Pulse 48  Temp(Src) 97.7 F (36.5 C) (Oral)  Resp 12  SpO2 95%  HEMODYNAMICS:    VENTILATOR SETTINGS: Vent Mode:  [-]  FiO2 (%):  [100 %] 100 %  INTAKE / OUTPUT:    PHYSICAL EXAMINATION: General:  Well appearing, NAD. Neuro:  Alert and interactive, NAD, moving all ext to command. HEENT:  [place]/AT, PERRL, EOM-I and MMM. Cardiovascular:  RRR, Nl S1/S2, -M/R/G. Lungs:  CTA bilaterally. Abdomen:  Soft, NT, ND and +BS. Musculoskeletal:  -edema and -tenderness.  AKA noted. Skin:   Intact.  LABS:  BMET  Recent Labs Lab 09/30/15 0803  NA 142  K 4.0  CL 113*  CO2 22  BUN 54*  CREATININE 1.55*  GLUCOSE 95    Electrolytes  Recent Labs Lab 09/30/15 0803  CALCIUM 9.5    CBC  Recent Labs Lab 09/30/15 0803  WBC 3.9*  HGB 9.7*  HCT 30.5*  PLT 200    Coag's  Recent Labs Lab 09/30/15 0819  APTT 33  INR 1.16    Sepsis Markers No results for input(s): LATICACIDVEN, PROCALCITON, O2SATVEN in the last 168 hours.  ABG No results for input(s): PHART, PCO2ART, PO2ART in the last 168 hours.  Liver Enzymes  Recent Labs Lab 09/30/15 0803  AST 11*  ALT 6*  ALKPHOS 57  BILITOT 0.4  ALBUMIN 2.8*    Cardiac Enzymes No results for input(s): TROPONINI, PROBNP in the last 168 hours.  Glucose No results for input(s): GLUCAP in the last 168 hours.  Imaging Ct Abdomen Pelvis W Contrast  09/30/2015  CLINICAL DATA:  Abdominal pain and rectal bleeding. EXAM: CT ABDOMEN AND PELVIS WITH CONTRAST TECHNIQUE: Multidetector CT imaging of the abdomen and pelvis was performed using the standard protocol following bolus administration of intravenous contrast. CONTRAST:  51m OMNIPAQUE IOHEXOL 300 MG/ML SOLN, 859mOMNIPAQUE IOHEXOL 300 MG/ML SOLN COMPARISON:  None. FINDINGS: Lower chest: There are two 4 mm left lower lobe pulmonary nodules (series  4/images 1 and 2). Mild cardiomegaly. Right coronary atherosclerosis. Hepatobiliary: Normal liver with no liver mass. Gallbladder contains a gas containing partially calcified 2.3 cm gallstone. Likely additional layering tiny subcentimeter gallstones. No gallbladder wall thickening or pericholecystic fat stranding. No biliary ductal dilatation. Pancreas: Normal, with no mass or duct dilation. Spleen: Normal size. No mass. Adrenals/Urinary Tract: There is a 1.1 cm hypodense right adrenal nodule (series 5/ image 45), with indeterminate density. Normal left adrenal. No hydronephrosis. There are 3 small simple right renal cysts  measuring up to 1.4 cm in size. There are several subcentimeter hypodense lesions in both kidneys, which are too small to characterize. Normal bladder. Stomach/Bowel: Small hiatal hernia. Otherwise grossly normal stomach. There is a small to moderate left inguinal hernia containing small bowel loops, with no small bowel wall thickening, focal small bowel caliber transition or small bowel pneumatosis. Normal caliber small bowel with no small bowel wall thickening. Appendix is not discretely visualized. There is circumferential wall thickening and mucosal hyperenhancement throughout the cecum and ascending colon, with associated pericolonic fat stranding and ill-defined fluid. There is mild sigmoid diverticulosis, with no colonic wall thickening or pericolonic fat stranding in association with the sigmoid diverticula. There is circumferential wall thickening and hyper enhancement with mild surrounding fat stranding at the anorectal junction. Vascular/Lymphatic: Atherosclerotic nonaneurysmal abdominal aorta. Patent portal, splenic, hepatic and renal veins. No pathologically enlarged lymph nodes in the abdomen or pelvis. Reproductive: Grossly normal uterus.  No adnexal mass. Other: No pneumoperitoneum. Trace free fluid in the pelvis. Small fat containing umbilical hernia. Small fat containing right inguinal hernia. Musculoskeletal: Marked degenerative changes in the visualized thoracolumbar spine. No aggressive appearing focal osseous lesions. IMPRESSION: 1. Circumferential wall thickening, mucosal hyperenhancement and pericolonic fat stranding in the cecum and ascending colon. Circumferential wall thickening and wall hyper enhancement at the anorectal junction. These are nonspecific findings that could represent an infectious or inflammatory proctocolitis, with the differential including C diff colitis, although an underlying neoplasm cannot be excluded and correlation with colonoscopy is advised when clinically  feasible if not recently performed. 2. Mild sigmoid diverticulosis. No colonic wall thickening in association with the areas of diverticulosis. 3. No evidence of bowel perforation or abscess. 4. Small moderate left inguinal hernia containing small bowel loops, with no evidence of small-bowel obstruction or ischemia. Small fat containing umbilical and right inguinal hernias. 5. Two left lower lobe pulmonary nodules, largest 4 mm. If the patient is at high risk for bronchogenic carcinoma, follow-up chest CT at 1 year is recommended. If the patient is at low risk, no follow-up is needed. This recommendation follows the consensus statement: Guidelines for Management of Small Pulmonary Nodules Detected on CT Scans: A Statement from the [employer] as published in Radiology 2005; 237:395-400. 6. Cholelithiasis. 7. Small indeterminate right adrenal nodule, probably a benign adenoma. Correlate with follow-up unenhanced CT abdomen or MRI abdomen in 12 months. Electronically Signed   By: Ilona Sorrel M.D.   On: 09/30/2015 09:40     STUDIES:  CT of abdomen that I reviewed myself with colon wall thickening.  CULTURES: None  ANTIBIOTICS: None  SIGNIFICANT EVENTS: 12/4 rectal bleeding.  LINES/TUBES: PIV  DISCUSSION: 81 year old presenting to the hospital with GI bleeding and syncope.  GI saw patient and recommended medical management for now.  Admission to the ICU for observation given syncopal episode for closer observation.  ASSESSMENT / PLAN:  PULMONARY A: No active issues. P:   - Titrate O2 for sat of 88-92%.  CARDIOVASCULAR A:  HTN and syncope, likely vagal. P:  - PO norvasc. - Tele monitoring. - Monitor in the ICU.  RENAL A:   Acute renal failure, likely due to hypovolemia and hemoconcentration. P:   - IVF resuscitation. - BMET in AM. - Replace electrolytes as indicated.  GASTROINTESTINAL A:   GI bleeding, likely lower with history of diverticulosis. P:   - NPO -  Protonix drip. - GI consult.  HEMATOLOGIC A:   No active issues. P:  - CBC in AM. - Transfuse per ICU protocol. - H&H q6.  INFECTIOUS A:   No active issues. P:   - Monitor WBC and fever curve.  ENDOCRINE A:   No active issues.   P:   - Monitor.  NEUROLOGIC A:   No active issues. P:   - Hold sedation. - Monitor.  FAMILY  - Updates: Patient updated bedside.  Discussed with EDP.  Rush Farmer, M.D. [facility] Pulmonary/Critical Care Medicine. Pager: [phone]. After hours pager: [phone].  09/30/2015, 11:22 AM

## 2015-09-30 NOTE — ED Notes (Signed)
Patient's code status verified with facility. Patient is a full code.

## 2015-09-30 NOTE — ED Notes (Addendum)
As I write this; pt. Is awake, alert and in no distress.  She has just been examined by Dr. Ardis Hughs (gastroenterology); and his P.A. Had been earlier.  She has no complaints and we have just begun her transfusion after all requisite information is confirmed and pt. Gave verbal consent.

## 2015-09-30 NOTE — ED Notes (Signed)
Pt sent to ED by staff at [facility] for rectal bleeding that started this AM

## 2015-10-01 DIAGNOSIS — R933 Abnormal findings on diagnostic imaging of other parts of digestive tract: Secondary | ICD-10-CM | POA: Diagnosis present

## 2015-10-01 DIAGNOSIS — K921 Melena: Secondary | ICD-10-CM | POA: Diagnosis present

## 2015-10-01 DIAGNOSIS — R579 Shock, unspecified: Secondary | ICD-10-CM

## 2015-10-01 DIAGNOSIS — K922 Gastrointestinal hemorrhage, unspecified: Secondary | ICD-10-CM

## 2015-10-01 LAB — BASIC METABOLIC PANEL
ANION GAP: 9 (ref 5–15)
BUN: 42 mg/dL — ABNORMAL HIGH (ref 6–20)
CALCIUM: 9.1 mg/dL (ref 8.9–10.3)
CO2: 18 mmol/L — ABNORMAL LOW (ref 22–32)
CREATININE: 1.31 mg/dL — AB (ref 0.44–1.00)
Chloride: 117 mmol/L — ABNORMAL HIGH (ref 101–111)
GFR, EST AFRICAN AMERICAN: 43 mL/min — AB (ref >60)
GFR, EST NON AFRICAN AMERICAN: 37 mL/min — AB (ref >60)
Glucose, Bld: 83 mg/dL (ref 65–99)
Potassium: 3.9 mmol/L (ref 3.5–5.1)
SODIUM: 144 mmol/L (ref 135–145)

## 2015-10-01 LAB — HEMOGLOBIN AND HEMATOCRIT, BLOOD
HEMATOCRIT: 31.4 % — AB (ref 36.0–46.0)
HEMATOCRIT: 32.8 % — AB (ref 36.0–46.0)
HEMOGLOBIN: 10.4 g/dL — AB (ref 12.0–15.0)
Hemoglobin: 10.8 g/dL — ABNORMAL LOW (ref 12.0–15.0)

## 2015-10-01 LAB — TYPE AND SCREEN
ABO/RH(D): O POS
ANTIBODY SCREEN: NEGATIVE
UNIT DIVISION: 0
Unit division: 0

## 2015-10-01 LAB — CBC
HEMATOCRIT: 31 % — AB (ref 36.0–46.0)
Hemoglobin: 10.2 g/dL — ABNORMAL LOW (ref 12.0–15.0)
MCH: 31.2 pg (ref 26.0–34.0)
MCHC: 32.9 g/dL (ref 30.0–36.0)
MCV: 94.8 fL (ref 78.0–100.0)
PLATELETS: 158 10*3/uL (ref 150–400)
RBC: 3.27 MIL/uL — ABNORMAL LOW (ref 3.87–5.11)
RDW: 17.4 % — AB (ref 11.5–15.5)
WBC: 9.1 10*3/uL (ref 4.0–10.5)

## 2015-10-01 LAB — GLUCOSE, CAPILLARY: GLUCOSE-CAPILLARY: 89 mg/dL (ref 65–99)

## 2015-10-01 LAB — PHOSPHORUS: PHOSPHORUS: 3.1 mg/dL (ref 2.5–4.6)

## 2015-10-01 LAB — MAGNESIUM: Magnesium: 1.5 mg/dL — ABNORMAL LOW (ref 1.7–2.4)

## 2015-10-01 MED ORDER — HYDRALAZINE HCL 20 MG/ML IJ SOLN
10.0000 mg | INTRAMUSCULAR | Status: DC | PRN
Start: 2015-10-01 — End: 2015-10-04
  Administered 2015-10-01: 10 mg via INTRAVENOUS
  Administered 2015-10-01: 20 mg via INTRAVENOUS
  Administered 2015-10-02: 10 mg via INTRAVENOUS
  Administered 2015-10-03 (×2): 20 mg via INTRAVENOUS
  Filled 2015-10-01 (×4): qty 1
  Filled 2015-10-01: qty 2
  Filled 2015-10-01: qty 1

## 2015-10-01 MED ORDER — CHLORHEXIDINE GLUCONATE CLOTH 2 % EX PADS
6.0000 | MEDICATED_PAD | Freq: Every day | CUTANEOUS | Status: DC
  Administered 2015-10-01 – 2015-10-04 (×4): 6 via TOPICAL

## 2015-10-01 MED ORDER — MAGNESIUM SULFATE 2 GM/50ML IV SOLN
2.0000 g | Freq: Once | INTRAVENOUS | Status: AC
  Administered 2015-10-01: 2 g via INTRAVENOUS
  Filled 2015-10-01: qty 50

## 2015-10-01 MED ORDER — BOOST / RESOURCE BREEZE PO LIQD
1.0000 | Freq: Three times a day (TID) | ORAL | Status: DC
  Administered 2015-10-01 – 2015-10-04 (×7): 1 via ORAL

## 2015-10-01 NOTE — Progress Notes (Signed)
PULMONARY / CRITICAL CARE MEDICINE   Name: Elizabeth Daniels MRN: [id number] DOB: Feb 27, 1931    ADMISSION DATE:  09/30/2015 CONSULTATION DATE:  09/30/2015  REFERRING MD:  EDP  CHIEF COMPLAINT:  GI bleed and LOC  HISTORY OF PRESENT ILLNESS:   81 year old female with PMH of HTN and diverticulosis who presents to the hospital with GI bleeding.  While in the ED patient had a syncopal episode, likely vagal and was unresponsive but improved quickly.  Starting noticing blood earlier today in her stool.  While in the hospital had another episode of bloody stool that caused her to become syncopal that recovered within 10 minutes.   SUBJECTIVE:  Denies pain No more active bleeding. C/o thirst  VITAL SIGNS: BP 152/60 mmHg  Pulse 93  Temp(Src) 98.9 F (37.2 C) (Oral)  Resp 17  Ht '5\' 6"'$  (1.676 m)  Wt 57.8 kg (127 lb 6.8 oz)  BMI 20.58 kg/m2  SpO2 100%  HEMODYNAMICS:    VENTILATOR SETTINGS: Vent Mode:  [-]  FiO2 (%):  [100 %] 100 %  INTAKE / OUTPUT: I/O last 3 completed shifts: In: 7741 [I.V.:3571; Blood:700] Out: -   PHYSICAL EXAMINATION: General:  Well appearing, NAD. Neuro:  Alert and interactive, NAD, moving all ext to command. HEENT:  [place]/AT, PERRL, EOM-I and MMM. Cardiovascular:  RRR, Nl S1/S2, -M/R/G. Lungs:  CTA bilaterally. Abdomen:  Soft, NT, ND and +BS. Musculoskeletal:  -edema and -tenderness.  AKA noted. Skin:  Intact.  LABS:  BMET  Recent Labs Lab 09/30/15 0803 10/01/15 0522  NA 142 144  K 4.0 3.9  CL 113* 117*  CO2 22 18*  BUN 54* 42*  CREATININE 1.55* 1.31*  GLUCOSE 95 83    Electrolytes  Recent Labs Lab 09/30/15 0803 10/01/15 0522  CALCIUM 9.5 9.1  MG  --  1.5*  PHOS  --  3.1    CBC  Recent Labs Lab 09/30/15 0803 09/30/15 1800 09/30/15 2316 10/01/15 0522  WBC 3.9*  --   --  9.1  HGB 9.7* 11.6* 10.8* 10.2*  HCT 30.5* 35.0* 32.6* 31.0*  PLT 200  --   --  158    Coag's  Recent Labs Lab 09/30/15 0819  APTT 33  INR  1.16    Sepsis Markers No results for input(s): LATICACIDVEN, PROCALCITON, O2SATVEN in the last 168 hours.  ABG No results for input(s): PHART, PCO2ART, PO2ART in the last 168 hours.  Liver Enzymes  Recent Labs Lab 09/30/15 0803  AST 11*  ALT 6*  ALKPHOS 57  BILITOT 0.4  ALBUMIN 2.8*    Cardiac Enzymes No results for input(s): TROPONINI, PROBNP in the last 168 hours.  Glucose No results for input(s): GLUCAP in the last 168 hours.  Imaging Ct Abdomen Pelvis W Contrast  09/30/2015  CLINICAL DATA:  Abdominal pain and rectal bleeding. EXAM: CT ABDOMEN AND PELVIS WITH CONTRAST TECHNIQUE: Multidetector CT imaging of the abdomen and pelvis was performed using the standard protocol following bolus administration of intravenous contrast. CONTRAST:  33m OMNIPAQUE IOHEXOL 300 MG/ML SOLN, 817mOMNIPAQUE IOHEXOL 300 MG/ML SOLN COMPARISON:  None. FINDINGS: Lower chest: There are two 4 mm left lower lobe pulmonary nodules (series 4/images 1 and 2). Mild cardiomegaly. Right coronary atherosclerosis. Hepatobiliary: Normal liver with no liver mass. Gallbladder contains a gas containing partially calcified 2.3 cm gallstone. Likely additional layering tiny subcentimeter gallstones. No gallbladder wall thickening or pericholecystic fat stranding. No biliary ductal dilatation. Pancreas: Normal, with no mass or duct dilation. Spleen:  Normal size. No mass. Adrenals/Urinary Tract: There is a 1.1 cm hypodense right adrenal nodule (series 5/ image 45), with indeterminate density. Normal left adrenal. No hydronephrosis. There are 3 small simple right renal cysts measuring up to 1.4 cm in size. There are several subcentimeter hypodense lesions in both kidneys, which are too small to characterize. Normal bladder. Stomach/Bowel: Small hiatal hernia. Otherwise grossly normal stomach. There is a small to moderate left inguinal hernia containing small bowel loops, with no small bowel wall thickening, focal small bowel  caliber transition or small bowel pneumatosis. Normal caliber small bowel with no small bowel wall thickening. Appendix is not discretely visualized. There is circumferential wall thickening and mucosal hyperenhancement throughout the cecum and ascending colon, with associated pericolonic fat stranding and ill-defined fluid. There is mild sigmoid diverticulosis, with no colonic wall thickening or pericolonic fat stranding in association with the sigmoid diverticula. There is circumferential wall thickening and hyper enhancement with mild surrounding fat stranding at the anorectal junction. Vascular/Lymphatic: Atherosclerotic nonaneurysmal abdominal aorta. Patent portal, splenic, hepatic and renal veins. No pathologically enlarged lymph nodes in the abdomen or pelvis. Reproductive: Grossly normal uterus.  No adnexal mass. Other: No pneumoperitoneum. Trace free fluid in the pelvis. Small fat containing umbilical hernia. Small fat containing right inguinal hernia. Musculoskeletal: Marked degenerative changes in the visualized thoracolumbar spine. No aggressive appearing focal osseous lesions. IMPRESSION: 1. Circumferential wall thickening, mucosal hyperenhancement and pericolonic fat stranding in the cecum and ascending colon. Circumferential wall thickening and wall hyper enhancement at the anorectal junction. These are nonspecific findings that could represent an infectious or inflammatory proctocolitis, with the differential including C diff colitis, although an underlying neoplasm cannot be excluded and correlation with colonoscopy is advised when clinically feasible if not recently performed. 2. Mild sigmoid diverticulosis. No colonic wall thickening in association with the areas of diverticulosis. 3. No evidence of bowel perforation or abscess. 4. Small moderate left inguinal hernia containing small bowel loops, with no evidence of small-bowel obstruction or ischemia. Small fat containing umbilical and right  inguinal hernias. 5. Two left lower lobe pulmonary nodules, largest 4 mm. If the patient is at high risk for bronchogenic carcinoma, follow-up chest CT at 1 year is recommended. If the patient is at low risk, no follow-up is needed. This recommendation follows the consensus statement: Guidelines for Management of Small Pulmonary Nodules Detected on CT Scans: A Statement from the [place] as published in Radiology 2005; 237:395-400. 6. Cholelithiasis. 7. Small indeterminate right adrenal nodule, probably a benign adenoma. Correlate with follow-up unenhanced CT abdomen or MRI abdomen in 12 months. Electronically Signed   By: Ilona Sorrel M.D.   On: 09/30/2015 09:40     STUDIES:  CT of abdomen 12/ 4 - colon wall thickening.  CULTURES: None  ANTIBIOTICS: None  SIGNIFICANT EVENTS: 12/4 rectal bleeding.  LINES/TUBES: PIV  DISCUSSION: 81 year old presenting to the hospital with GI bleeding and syncope.  GI saw patient and recommended medical management for now.  Admission to the ICU for observation given syncopal episode for closer observation.  ASSESSMENT / PLAN:  PULMONARY A: No active issues. P:   - Titrate O2 for sat of 88-92%.  CARDIOVASCULAR A:  HTN and syncope, likely vagal. P:  - PO meds held - Tele monitoring. - off cardene, use hydralazine prn -HOLD plavix  RENAL A:   Acute renal failure, likely due to hypovolemia and hemoconcentration -improving P:   - IVF resuscitation. - BMET in AM. - Replace electrolytes as  indicated.  GASTROINTESTINAL A:   GI bleeding, likely lower with history of diverticulosis. P:   - NPO - Protonix drip. - GI consulted >> eventual cscopy ,may also need EGD  HEMATOLOGIC A:   No active issues. P:  - CBC in AM. - Transfuse per ICU protocol. - H&H q6.  FAMILY  - Updates: Patient updated bedside.  Change to SDU Transfer to triad   Rigoberto Noel. MD [phone]  10/01/2015, 8:22 AM

## 2015-10-01 NOTE — Care Management Note (Signed)
Case Management Note  Patient Details  Name: Elizabeth Daniels MRN: [id number] Date of Birth: [birth year]  Subjective/Objective:              Rectal bleeding with hypotension      Action/Plan:Date: September 30, 2015 Chart reviewed for concurrent status and case management needs. Will continue to follow patient for changes and needs: Velva Harman, RN, BSN, [place]   [phone]  Expected Discharge Date:  10/05/15               Expected Discharge Plan:  Home/Self Care  In-House Referral:  NA  Discharge planning Services  CM Consult  Post Acute Care Choice:  NA Choice offered to:  NA  DME Arranged:    DME Agency:     HH Arranged:    HH Agency:     Status of Service:  Completed, signed off  Medicare Important Message Given:    Date Medicare IM Given:    Medicare IM give by:    Date Additional Medicare IM Given:    Additional Medicare Important Message give by:     If discussed at [place] of Stay Meetings, dates discussed:    Additional Comments:  Leeroy Cha, RN 10/01/2015, 8:52 AM

## 2015-10-01 NOTE — NC FL2 (Signed)
  [place] LEVEL OF CARE SCREENING TOOL     IDENTIFICATION  Patient Name: Elizabeth Daniels Birthdate: November 23, 1930 Sex: female Admission Date (Current Location): 09/30/2015  [facility] and [place] Number:     Facility and Address:  [facility],  [place] [address] [place]      Provider Number: [id number]  Attending Physician Name and Address:  Rush Farmer, MD  Relative Name and Phone Number:       Current Level of Care: Hospital Recommended Level of Care: [place] Prior Approval Number:    Date Approved/Denied:   PASRR Number: [id number]  Discharge Plan: SNF    Current Diagnoses: Patient Active Problem List   Diagnosis Date Noted  . Hematochezia   . Abnormal CT scan, colon   . GI bleed 09/30/2015    Orientation ACTIVITIES/SOCIAL BLADDER RESPIRATION    Self, Situation, Place  Passive Incontinent Normal  BEHAVIORAL SYMPTOMS/MOOD NEUROLOGICAL BOWEL NUTRITION STATUS   (no beaviors)   Continent  (clear liquids / expect pt to progress )  PHYSICIAN VISITS COMMUNICATION OF NEEDS Height & Weight Skin  Over 180 days Verbally '5\' 6"'$  (167.6 cm) 127 lbs. Normal          AMBULATORY STATUS RESPIRATION      Normal      Personal Care Assistance Level of Assistance  Bathing, Dressing Bathing Assistance: Maximum assistance   Dressing Assistance: Maximum assistance      Functional Limitations Info                SPECIAL CARE FACTORS FREQUENCY                      Additional Factors Info  Code Status, Allergies, Isolation Precautions, Psychotropic Code Status Info: Full Code       Isolation Precautions Info: MRSA + by pcr 09/30/15 no encounter      Current Medications (10/01/2015):  This is the current hospital active medication list Current Facility-Administered Medications  Medication Dose Route Frequency Provider Last Rate Last Dose  . antiseptic oral rinse (CPC / CETYLPYRIDINIUM CHLORIDE 0.05%)  solution 7 mL  7 mL Mouth Rinse BID Rush Farmer, MD   7 mL at 10/01/15 1000  . Chlorhexidine Gluconate Cloth 2 % PADS 6 each  6 each Topical Q0600 Rush Farmer, MD   6 each at 10/01/15 1000  . feeding supplement (BOOST / RESOURCE BREEZE) liquid 1 Container  1 Container Oral TID BM Clayton Bibles, RD      . hydrALAZINE (APRESOLINE) injection 10-40 mg  10-40 mg Intravenous Q4H PRN Kara Mead V, MD      . mupirocin ointment (BACTROBAN) 2 % 1 application  1 application Nasal BID Rush Farmer, MD   1 application at 37/62/83 [phone]  . pantoprazole (PROTONIX) 80 mg in sodium chloride 0.9 % 250 mL (0.32 mg/mL) infusion  8 mg/hr Intravenous Continuous Laban Emperor Zehr, PA-C 25 mL/hr at 10/01/15 0800 8 mg/hr at 10/01/15 0800  . [START ON 10/03/2015] pantoprazole (PROTONIX) injection 40 mg  40 mg Intravenous Q12H Loralie Champagne, PA-C         Discharge Medications: Please see discharge summary for a list of discharge medications.  Relevant Imaging Results:  Relevant Lab Results:  Recent Labs    Additional Information SS # [id number]  Keyshun Elpers, Randall An, LCSW

## 2015-10-01 NOTE — Progress Notes (Signed)
Initial Nutrition Assessment  DOCUMENTATION CODES:   Non-severe (moderate) malnutrition in context of acute illness/injury  INTERVENTION:   -Provide Boost Breeze po TID, each supplement provides 250 kcal and 9 grams of protein -Diet advancement per MD -RD to continue to monitor  NUTRITION DIAGNOSIS:   Inadequate oral intake related to inability to eat as evidenced by  (clear liquid diet).  GOAL:   Patient will meet greater than or equal to 90% of their needs  MONITOR:   PO intake, Supplement acceptance, Diet advancement, Labs, Weight trends, Skin, I & O's  REASON FOR ASSESSMENT:   Low [place]    ASSESSMENT:   81 year old female with PMH of HTN and diverticulosis who presents to the hospital with GI bleeding.  Patient in room with no family in room. Pt slightly confused and having trouble getting words out. Hands visibly shaking. Pt tolerating clear liquids and is willing to try Boost Breeze supplements. States her appetite has been good until a day PTA. Pt reports not weight loss.  Nutrition-Focused physical exam completed. Findings are mild fat depletion, moderate muscle depletion, and no edema.   Labs reviewed: Elevated BUN & Creatinine Low Mg Phos WNL  Diet Order:  Diet clear liquid Room service appropriate?: Yes; Fluid consistency:: Thin  Skin:  Reviewed, no issues  Last BM:  12/4  Height:   Ht Readings from Last 1 Encounters:  09/30/15 '5\' 6"'$  (1.676 m)    Weight:   Wt Readings from Last 1 Encounters:  10/01/15 127 lb 6.8 oz (57.8 kg)    Ideal Body Weight:  59.1 kg  BMI:  Body mass index is 20.58 kg/(m^2).  Estimated Nutritional Needs:   Kcal:  1500-1700  Protein:  80-90g  Fluid:  1.6L/day  EDUCATION NEEDS:   No education needs identified at this time  Clayton Bibles, MS, RD, LDN Pager: [phone] After Hours Pager: [phone]

## 2015-10-01 NOTE — Progress Notes (Signed)
Patient ID: Elizabeth Daniels, female   DOB: 16-Feb-1931, 81 y.o.   MRN: [id number]    Progress Note   Subjective   Feels fine, no c/o. Denies abdominal pain, and says no  BM or bleeding during the night. Hgb stable at 10 .2  Last plavix dose 12/3   Objective   Vital signs in last 24 hours: Temp:  [97.6 F (36.4 C)-100.1 F (37.8 C)] 98.9 F (37.2 C) (12/05 0800) Pulse Rate:  [48-95] 93 (12/05 0800) Resp:  [0-19] 17 (12/05 0800) BP: (92-210)/(49-137) 152/60 mmHg (12/05 0800) SpO2:  [95 %-100 %] 100 % (12/05 0800) FiO2 (%):  [100 %] 100 % (12/04 0848) Weight:  [122 lb 2.2 oz (55.4 kg)-127 lb 6.8 oz (57.8 kg)] 127 lb 6.8 oz (57.8 kg) (12/05 0400) Last BM Date: 09/30/15 General:     Elderly AA  female in NAD Heart:  Regular rate and rhythm; no murmurs Lungs: Respirations even and unlabored, lungs CTA bilaterally Abdomen:  Soft,tender RMQ and RLQ,and nondistended. Normal bowel sounds. Extremities:  Without edema. Neurologic:  Alert and oriented,  grossly normal neurologically. Psych:  Cooperative. Normal mood and affect.  Intake/Output from previous day: 12/04 0701 - 12/05 0700 In: 4271 [I.V.:3571; Blood:700] Out: -  Intake/Output this shift: Total I/O In: 25 [I.V.:25] Out: -   Lab Results:  Recent Labs  09/30/15 0803 09/30/15 1800 09/30/15 2316 10/01/15 0522  WBC 3.9*  --   --  9.1  HGB 9.7* 11.6* 10.8* 10.2*  HCT 30.5* 35.0* 32.6* 31.0*  PLT 200  --   --  158   BMET  Recent Labs  09/30/15 0803 10/01/15 0522  NA 142 144  K 4.0 3.9  CL 113* 117*  CO2 22 18*  GLUCOSE 95 83  BUN 54* 42*  CREATININE 1.55* 1.31*  CALCIUM 9.5 9.1   LFT  Recent Labs  09/30/15 0803  PROT 6.0*  ALBUMIN 2.8*  AST 11*  ALT 6*  ALKPHOS 57  BILITOT 0.4   PT/INR  Recent Labs  09/30/15 0819  LABPROT 15.0  INR 1.16    Studies/Results: Ct Abdomen Pelvis W Contrast  09/30/2015  CLINICAL DATA:  Abdominal pain and rectal bleeding. EXAM: CT ABDOMEN AND PELVIS WITH  CONTRAST TECHNIQUE: Multidetector CT imaging of the abdomen and pelvis was performed using the standard protocol following bolus administration of intravenous contrast. CONTRAST:  64m OMNIPAQUE IOHEXOL 300 MG/ML SOLN, 826mOMNIPAQUE IOHEXOL 300 MG/ML SOLN COMPARISON:  None. FINDINGS: Lower chest: There are two 4 mm left lower lobe pulmonary nodules (series 4/images 1 and 2). Mild cardiomegaly. Right coronary atherosclerosis. Hepatobiliary: Normal liver with no liver mass. Gallbladder contains a gas containing partially calcified 2.3 cm gallstone. Likely additional layering tiny subcentimeter gallstones. No gallbladder wall thickening or pericholecystic fat stranding. No biliary ductal dilatation. Pancreas: Normal, with no mass or duct dilation. Spleen: Normal size. No mass. Adrenals/Urinary Tract: There is a 1.1 cm hypodense right adrenal nodule (series 5/ image 45), with indeterminate density. Normal left adrenal. No hydronephrosis. There are 3 small simple right renal cysts measuring up to 1.4 cm in size. There are several subcentimeter hypodense lesions in both kidneys, which are too small to characterize. Normal bladder. Stomach/Bowel: Small hiatal hernia. Otherwise grossly normal stomach. There is a small to moderate left inguinal hernia containing small bowel loops, with no small bowel wall thickening, focal small bowel caliber transition or small bowel pneumatosis. Normal caliber small bowel with no small bowel wall thickening. Appendix is not discretely  visualized. There is circumferential wall thickening and mucosal hyperenhancement throughout the cecum and ascending colon, with associated pericolonic fat stranding and ill-defined fluid. There is mild sigmoid diverticulosis, with no colonic wall thickening or pericolonic fat stranding in association with the sigmoid diverticula. There is circumferential wall thickening and hyper enhancement with mild surrounding fat stranding at the anorectal junction.  Vascular/Lymphatic: Atherosclerotic nonaneurysmal abdominal aorta. Patent portal, splenic, hepatic and renal veins. No pathologically enlarged lymph nodes in the abdomen or pelvis. Reproductive: Grossly normal uterus.  No adnexal mass. Other: No pneumoperitoneum. Trace free fluid in the pelvis. Small fat containing umbilical hernia. Small fat containing right inguinal hernia. Musculoskeletal: Marked degenerative changes in the visualized thoracolumbar spine. No aggressive appearing focal osseous lesions. IMPRESSION: 1. Circumferential wall thickening, mucosal hyperenhancement and pericolonic fat stranding in the cecum and ascending colon. Circumferential wall thickening and wall hyper enhancement at the anorectal junction. These are nonspecific findings that could represent an infectious or inflammatory proctocolitis, with the differential including C diff colitis, although an underlying neoplasm cannot be excluded and correlation with colonoscopy is advised when clinically feasible if not recently performed. 2. Mild sigmoid diverticulosis. No colonic wall thickening in association with the areas of diverticulosis. 3. No evidence of bowel perforation or abscess. 4. Small moderate left inguinal hernia containing small bowel loops, with no evidence of small-bowel obstruction or ischemia. Small fat containing umbilical and right inguinal hernias. 5. Two left lower lobe pulmonary nodules, largest 4 mm. If the patient is at high risk for bronchogenic carcinoma, follow-up chest CT at 1 year is recommended. If the patient is at low risk, no follow-up is needed. This recommendation follows the consensus statement: Guidelines for Management of Small Pulmonary Nodules Detected on CT Scans: A Statement from the [place] as published in Radiology 2005; 237:395-400. 6. Cholelithiasis. 7. Small indeterminate right adrenal nodule, probably a benign adenoma. Correlate with follow-up unenhanced CT abdomen or MRI abdomen in  12 months. Electronically Signed   By: Ilona Sorrel M.D.   On: 09/30/2015 09:40       Assessment / Plan:     #1  81 yo female with acute lower GI bleed in setting of chronic Plavix. CT shows circumferential wall thickening of cecum and right colon, and anorectal junction . No current active bleeding. Etiology not clear  Plan: start clear liquids Stop PPI infusion, switch to QD PPI Continue serial Hb's and transfuse as needed. Will aim for Colonoscopy Wednesday to allow Plavix time to wash out.  Active Problems:   GI bleed     LOS: 1 day   [name]  10/01/2015, 8:25 AM     Attending physician's note   I have taken an interval history, reviewed the chart and examined the patient. I agree with the Advanced Practitioner's note, impression and recommendations.   Lucio Edward, MD [facility] [phone] Mon-Fri 8a-5p [phone] after 5p, weekends, holidays

## 2015-10-01 NOTE — Clinical Social Work Note (Signed)
Clinical Social Work Assessment  Patient Details  Name: Elizabeth Daniels MRN: [id number] Date of Birth: 1931-02-03  Date of referral:  10/01/15               Reason for consult:  Facility Placement, Discharge Planning                Permission sought to share information with:  Facility Art therapist granted to share information::  Yes, Verbal Permission Granted  Name::        Agency::     Relationship::     Contact Information:     Housing/Transportation Living arrangements for the past 2 months:  [place] of Information:  Patient, Adult Children Patient Interpreter Needed:  None Criminal Activity/Legal Involvement Pertinent to Current Situation/Hospitalization:  No - Comment as needed Significant Relationships:  Adult Children Lives with:    Do you feel safe going back to the place where you live?  Yes Need for family participation in patient care:  Yes (Comment)  Care giving concerns:  No concerns reported by pt or daughter.   Social Worker assessment / plan: Pt hospitalized on 09/30/15 with GI bleed and sepsis. Pt is a LTC resident from [place] met with pt and spoke with daughter Elizabeth Daniels [phone] ) to assist with d/c planning. Pt / daughter would like pt to return [facility] following hospital d/c. SNF contacted and has confirmed they are able to readmit once pt is stable. CSW will continue to follow to assist with d/c planning.  Employment status:  Retired Nurse, adult PT Recommendations:  Not assessed at this time Information / Referral to community resources:     Patient/Family's Response to care:  Pt / daughter agree LTC is needed.  Patient/Family's Understanding of and Emotional Response to Diagnosis, Current Treatment, and Prognosis:  CSW encouraged daughter to speak with MD for medical update. Pt reports she is feeling a little better and is looking forward to returning to  SNF.  Emotional Assessment Appearance:  Appears stated age Attitude/Demeanor/Rapport:  Other (cooperative) Affect (typically observed):  Accepting, Pleasant, Calm Orientation:  Oriented to Self, Oriented to Place, Oriented to Situation Alcohol / Substance use:  Not Applicable Psych involvement (Current and /or in the community):  No (Comment)  Discharge Needs  Concerns to be addressed:  Discharge Planning Concerns Readmission within the last 30 days:  No Current discharge risk:  None Barriers to Discharge:  No Barriers Identified   Loraine Maple  [phone] 10/01/2015, 2:44 PM

## 2015-10-01 NOTE — NC FL2 (Deleted)
  [place] LEVEL OF CARE SCREENING TOOL     IDENTIFICATION  Patient Name: Elizabeth Daniels Birthdate: 09-03-1931 Sex: female Admission Date (Current Location): 09/30/2015  [facility] and [place] Number:     Facility and Address:  [facility],  [place] [address] [place]      Provider Number: [id number]  Attending Physician Name and Address:  Rush Farmer, MD  Relative Name and Phone Number:       Current Level of Care: Hospital Recommended Level of Care: [place] Prior Approval Number:    Date Approved/Denied:   PASRR Number: [id number]  Discharge Plan: SNF    Current Diagnoses: Patient Active Problem List   Diagnosis Date Noted  . Hematochezia   . Abnormal CT scan, colon   . GI bleed 09/30/2015    Orientation ACTIVITIES/SOCIAL BLADDER RESPIRATION    Self, Situation, Place  Passive Incontinent Normal  BEHAVIORAL SYMPTOMS/MOOD NEUROLOGICAL BOWEL NUTRITION STATUS   (no beaviors)   Continent  (clear liquids / expect pt to progress )  PHYSICIAN VISITS COMMUNICATION OF NEEDS Height & Weight Skin  Over 180 days Verbally '5\' 6"'$  (167.6 cm) 127 lbs. Normal          AMBULATORY STATUS RESPIRATION      Normal      Personal Care Assistance Level of Assistance  Bathing, Dressing Bathing Assistance: Maximum assistance   Dressing Assistance: Maximum assistance      Functional Limitations Info                SPECIAL CARE FACTORS FREQUENCY                      Additional Factors Info  Code Status, Allergies, Isolation Precautions, Psychotropic Code Status Info: Full Code       Isolation Precautions Info: MRSA + by pcr 09/30/15 no encounter      Current Medications (10/01/2015):  This is the current hospital active medication list Current Facility-Administered Medications  Medication Dose Route Frequency Provider Last Rate Last Dose  . antiseptic oral rinse (CPC / CETYLPYRIDINIUM CHLORIDE 0.05%)  solution 7 mL  7 mL Mouth Rinse BID Rush Farmer, MD   7 mL at 10/01/15 1000  . Chlorhexidine Gluconate Cloth 2 % PADS 6 each  6 each Topical Q0600 Rush Farmer, MD   6 each at 10/01/15 1000  . feeding supplement (BOOST / RESOURCE BREEZE) liquid 1 Container  1 Container Oral TID BM Clayton Bibles, RD      . hydrALAZINE (APRESOLINE) injection 10-40 mg  10-40 mg Intravenous Q4H PRN Kara Mead V, MD      . mupirocin ointment (BACTROBAN) 2 % 1 application  1 application Nasal BID Rush Farmer, MD   1 application at 76/54/65 [phone]  . pantoprazole (PROTONIX) 80 mg in sodium chloride 0.9 % 250 mL (0.32 mg/mL) infusion  8 mg/hr Intravenous Continuous Laban Emperor Zehr, PA-C 25 mL/hr at 10/01/15 0800 8 mg/hr at 10/01/15 0800  . [START ON 10/03/2015] pantoprazole (PROTONIX) injection 40 mg  40 mg Intravenous Q12H Loralie Champagne, PA-C         Discharge Medications: Please see discharge summary for a list of discharge medications.  Relevant Imaging Results:  Relevant Lab Results:  Recent Labs    Additional Information SS # [id number]  Teja Costen, Randall An, LCSW

## 2015-10-02 ENCOUNTER — Encounter (HOSPITAL_COMMUNITY): Payer: Self-pay | Admitting: Internal Medicine

## 2015-10-02 DIAGNOSIS — I509 Heart failure, unspecified: Secondary | ICD-10-CM

## 2015-10-02 DIAGNOSIS — R55 Syncope and collapse: Secondary | ICD-10-CM | POA: Diagnosis present

## 2015-10-02 DIAGNOSIS — N179 Acute kidney failure, unspecified: Secondary | ICD-10-CM | POA: Diagnosis present

## 2015-10-02 DIAGNOSIS — D62 Acute posthemorrhagic anemia: Secondary | ICD-10-CM | POA: Diagnosis present

## 2015-10-02 DIAGNOSIS — R1310 Dysphagia, unspecified: Secondary | ICD-10-CM

## 2015-10-02 DIAGNOSIS — R933 Abnormal findings on diagnostic imaging of other parts of digestive tract: Secondary | ICD-10-CM

## 2015-10-02 DIAGNOSIS — F411 Generalized anxiety disorder: Secondary | ICD-10-CM

## 2015-10-02 DIAGNOSIS — K219 Gastro-esophageal reflux disease without esophagitis: Secondary | ICD-10-CM

## 2015-10-02 DIAGNOSIS — I739 Peripheral vascular disease, unspecified: Secondary | ICD-10-CM

## 2015-10-02 DIAGNOSIS — H409 Unspecified glaucoma: Secondary | ICD-10-CM

## 2015-10-02 DIAGNOSIS — I10 Essential (primary) hypertension: Secondary | ICD-10-CM | POA: Diagnosis present

## 2015-10-02 DIAGNOSIS — E440 Moderate protein-calorie malnutrition: Secondary | ICD-10-CM | POA: Diagnosis present

## 2015-10-02 DIAGNOSIS — M109 Gout, unspecified: Secondary | ICD-10-CM | POA: Diagnosis present

## 2015-10-02 DIAGNOSIS — N189 Chronic kidney disease, unspecified: Secondary | ICD-10-CM | POA: Diagnosis present

## 2015-10-02 HISTORY — DX: Peripheral vascular disease, unspecified: I73.9

## 2015-10-02 HISTORY — DX: Dysphagia, unspecified: R13.10

## 2015-10-02 HISTORY — DX: Unspecified glaucoma: H40.9

## 2015-10-02 HISTORY — DX: Gastro-esophageal reflux disease without esophagitis: K21.9

## 2015-10-02 HISTORY — DX: Generalized anxiety disorder: F41.1

## 2015-10-02 HISTORY — DX: Heart failure, unspecified: I50.9

## 2015-10-02 HISTORY — DX: Gout, unspecified: M10.9

## 2015-10-02 LAB — MAGNESIUM: Magnesium: 2.1 mg/dL (ref 1.7–2.4)

## 2015-10-02 LAB — BASIC METABOLIC PANEL WITH GFR
Anion gap: 8 (ref 5–15)
BUN: 35 mg/dL — ABNORMAL HIGH (ref 6–20)
CO2: 18 mmol/L — ABNORMAL LOW (ref 22–32)
Calcium: 9.1 mg/dL (ref 8.9–10.3)
Chloride: 116 mmol/L — ABNORMAL HIGH (ref 101–111)
Creatinine, Ser: 1.19 mg/dL — ABNORMAL HIGH (ref 0.44–1.00)
GFR calc Af Amer: 48 mL/min — ABNORMAL LOW
GFR calc non Af Amer: 41 mL/min — ABNORMAL LOW
Glucose, Bld: 113 mg/dL — ABNORMAL HIGH (ref 65–99)
Potassium: 3.8 mmol/L (ref 3.5–5.1)
Sodium: 142 mmol/L (ref 135–145)

## 2015-10-02 LAB — HEMOGLOBIN AND HEMATOCRIT, BLOOD
HCT: 30 % — ABNORMAL LOW (ref 36.0–46.0)
HCT: 31.9 % — ABNORMAL LOW (ref 36.0–46.0)
Hemoglobin: 10.5 g/dL — ABNORMAL LOW (ref 12.0–15.0)
Hemoglobin: 9.9 g/dL — ABNORMAL LOW (ref 12.0–15.0)

## 2015-10-02 MED ORDER — GABAPENTIN 300 MG PO CAPS
300.0000 mg | ORAL_CAPSULE | Freq: Three times a day (TID) | ORAL | Status: DC
  Administered 2015-10-02 – 2015-10-04 (×5): 300 mg via ORAL
  Filled 2015-10-02 (×5): qty 1

## 2015-10-02 MED ORDER — DORZOLAMIDE HCL-TIMOLOL MAL 2-0.5 % OP SOLN
1.0000 [drp] | Freq: Two times a day (BID) | OPHTHALMIC | Status: DC
  Administered 2015-10-02 – 2015-10-04 (×5): 1 [drp] via OPHTHALMIC
  Filled 2015-10-02: qty 10

## 2015-10-02 MED ORDER — POLYVINYL ALCOHOL 1.4 % OP SOLN: 1.0000 [drp] | OPHTHALMIC | Status: DC | PRN

## 2015-10-02 MED ORDER — VITAMIN D 1000 UNITS PO TABS
1000.0000 [IU] | ORAL_TABLET | Freq: Every day | ORAL | Status: DC
  Administered 2015-10-02 – 2015-10-04 (×2): 1000 [IU] via ORAL
  Filled 2015-10-02 (×2): qty 1

## 2015-10-02 MED ORDER — LABETALOL HCL 5 MG/ML IV SOLN
20.0000 mg | INTRAVENOUS | Status: DC | PRN
  Administered 2015-10-02 (×2): 20 mg via INTRAVENOUS
  Filled 2015-10-02 (×2): qty 4

## 2015-10-02 MED ORDER — PEG-KCL-NACL-NASULF-NA ASC-C 100 G PO SOLR
0.5000 | Freq: Once | ORAL | Status: AC
  Administered 2015-10-02: 100 g via ORAL
  Filled 2015-10-02 (×2): qty 1

## 2015-10-02 MED ORDER — LABETALOL HCL 200 MG PO TABS
300.0000 mg | ORAL_TABLET | Freq: Three times a day (TID) | ORAL | Status: DC
  Administered 2015-10-02 – 2015-10-04 (×5): 300 mg via ORAL
  Filled 2015-10-02 (×5): qty 1

## 2015-10-02 MED ORDER — ACETAMINOPHEN 325 MG PO TABS
650.0000 mg | ORAL_TABLET | Freq: Four times a day (QID) | ORAL | Status: DC | PRN
  Administered 2015-10-02 – 2015-10-04 (×2): 650 mg via ORAL
  Filled 2015-10-02 (×2): qty 2

## 2015-10-02 MED ORDER — PEG-KCL-NACL-NASULF-NA ASC-C 100 G PO SOLR: 1.0000 | Freq: Once | ORAL | Status: DC

## 2015-10-02 MED ORDER — ALLOPURINOL 100 MG PO TABS
100.0000 mg | ORAL_TABLET | Freq: Every day | ORAL | Status: DC
  Administered 2015-10-02 – 2015-10-04 (×2): 100 mg via ORAL
  Filled 2015-10-02 (×2): qty 1

## 2015-10-02 MED ORDER — LATANOPROST 0.005 % OP SOLN
1.0000 [drp] | Freq: Every day | OPHTHALMIC | Status: DC
  Filled 2015-10-02: qty 2.5

## 2015-10-02 MED ORDER — PEG-KCL-NACL-NASULF-NA ASC-C 100 G PO SOLR
0.5000 | Freq: Once | ORAL | Status: AC
  Administered 2015-10-03: 100 g via ORAL

## 2015-10-02 MED ORDER — LABETALOL HCL 200 MG PO TABS: 300.0000 mg | ORAL_TABLET | Freq: Three times a day (TID) | ORAL | Status: DC

## 2015-10-02 NOTE — Progress Notes (Signed)
[facility] PROGRESS NOTE  Elizabeth Daniels RJJ:[id number] DOB: 04-09-1931 DOA: 09/30/2015 PCP: No primary care provider on file.  Assessment/Plan: #1 GI bleed Likely lower GI bleed patient with history of diverticulosis. Patient with no bleeding overnight. Patient is status post 2 units packed red blood cells. Hemoglobin currently at 9.9. Continue PPI. Plavix on hold since admission. Patient is being followed by gastroenterology and patient for probable colonoscopy tomorrow. Continue clear liquids for now. Per GI.  #2 acute blood loss anemia Secondary to problem #1. Patient status post 2 units packed red blood cells on 09/30/2015. H&H stable. Hemoglobin currently at 9.9.  #3 syncope Likely vasovagal secondary to #1. No further syncopal episodes. Hemoglobin stable at 9.9. Follow.  #4 hypertension Patient's oral hypertensive medications were held on admission due to syncope and GI bleed. Will place patient back on home regimen of labetalol 300 mg 3 times daily. Follow.  #5 acute on chronic kidney disease stage III Likely secondary to prerenal azotemia in the setting of diuretics. Renal function improved and currently at baseline. Follow.  #6 peripheral vascular disease Plavix on hold secondary to problem #1. GI to advise when Plavix may be resumed.  #7 history of gout Stable. Resume allopurinol.  #8 history of glaucoma Resume eyedrops.  #9 gastroesophageal reflux disease PPI.  #10 history of CHF Stable. Compensated. Diuretics on hold. Moderate to volume status closely.  #11 abnormal CT CT with colonic wall thickening. Questionable etiology. GI following. Patient for colonoscopy tomorrow.  #12 moderate malnutrition Wise patient has been started on a diet will likely need nutritional supplementation.  #13 prophylaxis PPI for GI prophylaxis. SCDs for DVT prophylaxis.  Code Status: Full Family Communication: Updated patient. No family at bedside. Disposition Plan:  Remain in the step down unit.   Consultants:  Gastroenterology: Dr. Ardis Hughs 09/30/2015  Procedures:  2 units packed red blood cells 09/30/2015  CT abdomen and pelvis 09/30/2015    Antibiotics:  None  HPI/Subjective: Patient alert. Patient denies any chest pain. No shortness of breath. Patient denies any bloody bowel movements.  Objective: Filed Vitals:   10/02/15 0700 10/02/15 0800  BP: 171/46 177/109  Pulse: 81 94  Temp:  98.2 F (36.8 C)  Resp: 14 17    Intake/Output Summary (Last 24 hours) at 10/02/15 0936 Last data filed at 10/02/15 0916  Gross per 24 hour  Intake    865 ml  Output      0 ml  Net    865 ml   Filed Weights   09/30/15 1300 10/01/15 0400 10/02/15 0336  Weight: 55.4 kg (122 lb 2.2 oz) 57.8 kg (127 lb 6.8 oz) 57.7 kg (127 lb 3.3 oz)    Exam:   General:  NAD  Cardiovascular: RRR  Respiratory: CTAB  Abdomen: Soft, nontender, nondistended, positive bowel sounds.  Musculoskeletal: No clubbing cyanosis or edema. S/p R AKA  Data Reviewed: Basic Metabolic Panel:  Recent Labs Lab 09/30/15 0803 10/01/15 0522 10/02/15 0817  NA 142 144 142  K 4.0 3.9 3.8  CL 113* 117* 116*  CO2 22 18* 18*  GLUCOSE 95 83 113*  BUN 54* 42* 35*  CREATININE 1.55* 1.31* 1.19*  CALCIUM 9.5 9.1 9.1  MG  --  1.5* 2.1  PHOS  --  3.1  --    Liver Function Tests:  Recent Labs Lab 09/30/15 0803  AST 11*  ALT 6*  ALKPHOS 57  BILITOT 0.4  PROT 6.0*  ALBUMIN 2.8*   No results for input(s): LIPASE,  AMYLASE in the last 168 hours. No results for input(s): AMMONIA in the last 168 hours. CBC:  Recent Labs Lab 09/30/15 0803  10/01/15 0522 10/01/15 1120 10/01/15 1810 10/01/15 2331 10/02/15 0601  WBC 3.9*  --  9.1  --   --   --   --   NEUTROABS 2.6  --   --   --   --   --   --   HGB 9.7*  < > 10.2* 10.4* 10.8* 10.5* 9.9*  HCT 30.5*  < > 31.0* 31.4* 32.8* 31.9* 30.0*  MCV 101.3*  --  94.8  --   --   --   --   PLT 200  --  158  --   --   --   --   <  > = values in this interval not displayed. Cardiac Enzymes: No results for input(s): CKTOTAL, CKMB, CKMBINDEX, TROPONINI in the last 168 hours. BNP (last 3 results) No results for input(s): BNP in the last 8760 hours.  ProBNP (last 3 results) No results for input(s): PROBNP in the last 8760 hours.  CBG:  Recent Labs Lab 10/01/15 2046  GLUCAP 89    Recent Results (from the past 240 hour(s))  MRSA PCR Screening     Status: Abnormal   Collection Time: 09/30/15  1:08 PM  Result Value Ref Range Status   MRSA by PCR POSITIVE (A) NEGATIVE Final    Comment:        The GeneXpert MRSA Assay (FDA approved for NASAL specimens only), is one component of a comprehensive MRSA colonization surveillance program. It is not intended to diagnose MRSA infection nor to guide or monitor treatment for MRSA infections. RESULT CALLED TO, READ BACK BY AND VERIFIED WITH: Bethann Humble RN @ 1939 ON 09/30/15 BY C DAVIS      Studies: No results found.  Scheduled Meds: . allopurinol  100 mg Oral Daily  . antiseptic oral rinse  7 mL Mouth Rinse BID  . Chlorhexidine Gluconate Cloth  6 each Topical Q0600  . cholecalciferol  1,000 Units Oral Daily  . dorzolamide-timolol  1 drop Both Eyes BID  . feeding supplement  1 Container Oral TID BM  . gabapentin  300 mg Oral TID  . labetalol  300 mg Oral TID  . latanoprost  1 drop Both Eyes QHS  . mupirocin ointment  1 application Nasal BID  . [START ON 10/03/2015] pantoprazole (PROTONIX) IV  40 mg Intravenous Q12H   Continuous Infusions: . pantoprozole (PROTONIX) infusion 8 mg/hr (10/02/15 0338)    Principal Problem:   GI bleed Active Problems:   Hematochezia   Abnormal CT scan, colon   Malnutrition of moderate degree   HTN (hypertension)   Syncope   Acute blood loss anemia   Acute kidney injury superimposed on CKD (HCC)   GERD (gastroesophageal reflux disease)   Glaucoma   Gout   PVD (peripheral vascular disease) (HCC)   Dysphagia   Generalized  anxiety disorder   CHF (congestive heart failure) ([place])    Time spent: 62 minutes    THOMPSON,DANIEL M.D. [facility] Pager [phone]. If 7PM-7AM, please contact night-coverage at [email or web address], password [facility] 10/02/2015, 9:36 AM  LOS: 2 days

## 2015-10-02 NOTE — Progress Notes (Signed)
Patient ID: Elizabeth Daniels, female   DOB: 24-Sep-1931, 81 y.o.   MRN: [id number]    Progress Note   Subjective  Pt feels Ok- no active bleeding, had BM last night. Mild abdominal discomfort, tolerating liquids HGB 9.9   Objective   Vital signs in last 24 hours: Temp:  [98.2 F (36.8 C)-98.9 F (37.2 C)] 98.2 F (36.8 C) (12/06 0800) Pulse Rate:  [81-111] 94 (12/06 0800) Resp:  [0-23] 17 (12/06 0800) BP: (144-209)/(46-109) 177/109 mmHg (12/06 0800) SpO2:  [100 %] 100 % (12/06 0800) Weight:  [127 lb 3.3 oz (57.7 kg)] 127 lb 3.3 oz (57.7 kg) (12/06 0336) Last BM Date: 09/30/15 General:   Elderly AA female in NAD Heart:  Regular rate and rhythm; no murmurs Lungs: Respirations even and unlabored, lungs CTA bilaterally Abdomen:  Soft, mildly tender RMQ/RLQ and nondistended. Normal bowel sounds. Extremities:  Without edema,s/p R AKA. Neurologic:  Alert and oriented,  grossly normal neurologically. Psych:  Cooperative. Normal mood and affect.  Intake/Output from previous day: 12/05 0701 - 12/06 0700 In: 73 [P.O.:240; I.V.:475] Out: -  Intake/Output this shift: Total I/O In: 200 [P.O.:200] Out: -   Lab Results:  Recent Labs  09/30/15 0803  10/01/15 0522  10/01/15 1810 10/01/15 2331 10/02/15 0601  WBC 3.9*  --  9.1  --   --   --   --   HGB 9.7*  < > 10.2*  < > 10.8* 10.5* 9.9*  HCT 30.5*  < > 31.0*  < > 32.8* 31.9* 30.0*  PLT 200  --  158  --   --   --   --   < > = values in this interval not displayed. BMET  Recent Labs  09/30/15 0803 10/01/15 0522 10/02/15 0817  NA 142 144 142  K 4.0 3.9 3.8  CL 113* 117* 116*  CO2 22 18* 18*  GLUCOSE 95 83 113*  BUN 54* 42* 35*  CREATININE 1.55* 1.31* 1.19*  CALCIUM 9.5 9.1 9.1   LFT  Recent Labs  09/30/15 0803  PROT 6.0*  ALBUMIN 2.8*  AST 11*  ALT 6*  ALKPHOS 57  BILITOT 0.4   PT/INR  Recent Labs  09/30/15 0819  LABPROT 15.0  INR 1.16    Studies/Results: Ct Abdomen Pelvis W Contrast  09/30/2015   CLINICAL DATA:  Abdominal pain and rectal bleeding. EXAM: CT ABDOMEN AND PELVIS WITH CONTRAST TECHNIQUE: Multidetector CT imaging of the abdomen and pelvis was performed using the standard protocol following bolus administration of intravenous contrast. CONTRAST:  66m OMNIPAQUE IOHEXOL 300 MG/ML SOLN, 840mOMNIPAQUE IOHEXOL 300 MG/ML SOLN COMPARISON:  None. FINDINGS: Lower chest: There are two 4 mm left lower lobe pulmonary nodules (series 4/images 1 and 2). Mild cardiomegaly. Right coronary atherosclerosis. Hepatobiliary: Normal liver with no liver mass. Gallbladder contains a gas containing partially calcified 2.3 cm gallstone. Likely additional layering tiny subcentimeter gallstones. No gallbladder wall thickening or pericholecystic fat stranding. No biliary ductal dilatation. Pancreas: Normal, with no mass or duct dilation. Spleen: Normal size. No mass. Adrenals/Urinary Tract: There is a 1.1 cm hypodense right adrenal nodule (series 5/ image 45), with indeterminate density. Normal left adrenal. No hydronephrosis. There are 3 small simple right renal cysts measuring up to 1.4 cm in size. There are several subcentimeter hypodense lesions in both kidneys, which are too small to characterize. Normal bladder. Stomach/Bowel: Small hiatal hernia. Otherwise grossly normal stomach. There is a small to moderate left inguinal hernia containing small bowel loops, with  no small bowel wall thickening, focal small bowel caliber transition or small bowel pneumatosis. Normal caliber small bowel with no small bowel wall thickening. Appendix is not discretely visualized. There is circumferential wall thickening and mucosal hyperenhancement throughout the cecum and ascending colon, with associated pericolonic fat stranding and ill-defined fluid. There is mild sigmoid diverticulosis, with no colonic wall thickening or pericolonic fat stranding in association with the sigmoid diverticula. There is circumferential wall thickening  and hyper enhancement with mild surrounding fat stranding at the anorectal junction. Vascular/Lymphatic: Atherosclerotic nonaneurysmal abdominal aorta. Patent portal, splenic, hepatic and renal veins. No pathologically enlarged lymph nodes in the abdomen or pelvis. Reproductive: Grossly normal uterus.  No adnexal mass. Other: No pneumoperitoneum. Trace free fluid in the pelvis. Small fat containing umbilical hernia. Small fat containing right inguinal hernia. Musculoskeletal: Marked degenerative changes in the visualized thoracolumbar spine. No aggressive appearing focal osseous lesions. IMPRESSION: 1. Circumferential wall thickening, mucosal hyperenhancement and pericolonic fat stranding in the cecum and ascending colon. Circumferential wall thickening and wall hyper enhancement at the anorectal junction. These are nonspecific findings that could represent an infectious or inflammatory proctocolitis, with the differential including C diff colitis, although an underlying neoplasm cannot be excluded and correlation with colonoscopy is advised when clinically feasible if not recently performed. 2. Mild sigmoid diverticulosis. No colonic wall thickening in association with the areas of diverticulosis. 3. No evidence of bowel perforation or abscess. 4. Small moderate left inguinal hernia containing small bowel loops, with no evidence of small-bowel obstruction or ischemia. Small fat containing umbilical and right inguinal hernias. 5. Two left lower lobe pulmonary nodules, largest 4 mm. If the patient is at high risk for bronchogenic carcinoma, follow-up chest CT at 1 year is recommended. If the patient is at low risk, no follow-up is needed. This recommendation follows the consensus statement: Guidelines for Management of Small Pulmonary Nodules Detected on CT Scans: A Statement from the [employer] as published in Radiology 2005; 237:395-400. 6. Cholelithiasis. 7. Small indeterminate right adrenal nodule,  probably a benign adenoma. Correlate with follow-up unenhanced CT abdomen or MRI abdomen in 12 months. Electronically Signed   By: Ilona Sorrel M.D.   On: 09/30/2015 09:40       Assessment / Plan:    #1 81 yo female with acute lower GI bleed in setting of Plavix- stable, no active bleeding. Off Plavix  since 12/2 CT showed cecal and right colon wall thickening, and inflammation of anorectum- ? Ischemic, IBD #2 CKD- #3 HTN #4 chronic antiplatelet therapy- unclear reason #5 anemia- secondary to blood loss #6 s/p right AKA  Plan:  Continue clear liquids today Scheduled for colonoscopy tomorrow 11 am-with Dr Fuller Plan , procedure discussed in detail with pt and she is agreeable to proceed- prep later in day  Active Problems:   GI bleed   Hematochezia   Abnormal CT scan, colon   Malnutrition of moderate degree    LOS: 2 days   [name]  10/02/2015, 9:16 AM      Attending physician's note   I have taken an interval history, reviewed the chart and examined the patient. I agree with the Advanced Practitioner's note, impression and recommendations. No recurrent bleeding. Remains off Plavix. Hb=9.9. Colonoscopy tomorrow. Pts. daughter in room during my visit. The risks (including bleeding, perforation, infection, missed lesions, medication reactions and possible hospitalization or surgery if complications occur), benefits, and alternatives to colonoscopy with possible biopsy and possible polypectomy were discussed with the patient and they consent  to proceed.    Lucio Edward, MD [facility] [phone] Mon-Fri 8a-5p [phone] after 5p, weekends, holidays

## 2015-10-02 NOTE — Progress Notes (Signed)
[facility] Progress Note Patient Name: Elizabeth Daniels DOB: Aug 19, 1931 MRN: [id number]   Date of Service  10/02/2015  HPI/Events of Note  Hypertension pain  eICU Interventions  Prn labetalol Prn APAP     Intervention Category Intermediate Interventions: Hypertension - evaluation and management;Pain - evaluation and management  Simonne Maffucci 10/02/2015, 12:34 AM

## 2015-10-03 ENCOUNTER — Encounter (HOSPITAL_COMMUNITY): Payer: Self-pay

## 2015-10-03 ENCOUNTER — Inpatient Hospital Stay (HOSPITAL_COMMUNITY): Payer: Medicare Other | Admitting: Certified Registered Nurse Anesthetist

## 2015-10-03 ENCOUNTER — Encounter (HOSPITAL_COMMUNITY)
Admission: EM | Disposition: A | Discharge status: Skilled Nursing Facility | Payer: Self-pay | Source: Home / Self Care | Attending: Pulmonary Disease

## 2015-10-03 DIAGNOSIS — N179 Acute kidney failure, unspecified: Secondary | ICD-10-CM

## 2015-10-03 DIAGNOSIS — N189 Chronic kidney disease, unspecified: Secondary | ICD-10-CM

## 2015-10-03 DIAGNOSIS — K921 Melena: Secondary | ICD-10-CM | POA: Insufficient documentation

## 2015-10-03 DIAGNOSIS — D62 Acute posthemorrhagic anemia: Secondary | ICD-10-CM

## 2015-10-03 HISTORY — PX: COLONOSCOPY WITH PROPOFOL: SHX5780

## 2015-10-03 LAB — CBC
HEMATOCRIT: 26.7 % — AB (ref 36.0–46.0)
HEMOGLOBIN: 8.8 g/dL — AB (ref 12.0–15.0)
MCH: 32 pg (ref 26.0–34.0)
MCHC: 33 g/dL (ref 30.0–36.0)
MCV: 97.1 fL (ref 78.0–100.0)
Platelets: 152 10*3/uL (ref 150–400)
RBC: 2.75 MIL/uL — AB (ref 3.87–5.11)
RDW: 15.9 % — AB (ref 11.5–15.5)
WBC: 6.2 10*3/uL (ref 4.0–10.5)

## 2015-10-03 LAB — BASIC METABOLIC PANEL
ANION GAP: 7 (ref 5–15)
BUN: 33 mg/dL — ABNORMAL HIGH (ref 6–20)
CHLORIDE: 119 mmol/L — AB (ref 101–111)
CO2: 18 mmol/L — ABNORMAL LOW (ref 22–32)
Calcium: 9 mg/dL (ref 8.9–10.3)
Creatinine, Ser: 1.27 mg/dL — ABNORMAL HIGH (ref 0.44–1.00)
GFR, EST AFRICAN AMERICAN: 44 mL/min — AB (ref >60)
GFR, EST NON AFRICAN AMERICAN: 38 mL/min — AB (ref >60)
Glucose, Bld: 86 mg/dL (ref 65–99)
POTASSIUM: 3.8 mmol/L (ref 3.5–5.1)
SODIUM: 144 mmol/L (ref 135–145)

## 2015-10-03 SURGERY — COLONOSCOPY WITH PROPOFOL
Anesthesia: Monitor Anesthesia Care

## 2015-10-03 MED ORDER — PROMETHAZINE HCL 25 MG/ML IJ SOLN: 6.2500 mg | INTRAMUSCULAR | Status: DC | PRN

## 2015-10-03 MED ORDER — LIDOCAINE HCL (CARDIAC) 20 MG/ML IV SOLN
INTRAVENOUS | Status: AC
  Filled 2015-10-03: qty 5

## 2015-10-03 MED ORDER — LIDOCAINE HCL (CARDIAC) 20 MG/ML IV SOLN
INTRAVENOUS | Status: DC | PRN
  Administered 2015-10-03: 50 mg via INTRAVENOUS

## 2015-10-03 MED ORDER — PROPOFOL 10 MG/ML IV BOLUS
INTRAVENOUS | Status: DC | PRN
  Administered 2015-10-03: 20 mg via INTRAVENOUS
  Administered 2015-10-03: 50 mg via INTRAVENOUS
  Administered 2015-10-03: 40 mg via INTRAVENOUS
  Administered 2015-10-03: 30 mg via INTRAVENOUS
  Administered 2015-10-03: 40 mg via INTRAVENOUS
  Administered 2015-10-03: 30 mg via INTRAVENOUS

## 2015-10-03 MED ORDER — SODIUM CHLORIDE 0.9 % IV SOLN
INTRAVENOUS | Status: DC | PRN
  Administered 2015-10-03: 14:00:00 via INTRAVENOUS

## 2015-10-03 MED ORDER — PHENYLEPHRINE HCL 10 MG/ML IJ SOLN
INTRAMUSCULAR | Status: DC | PRN
  Administered 2015-10-03: 20 ug via INTRAVENOUS

## 2015-10-03 MED ORDER — PROPOFOL 10 MG/ML IV BOLUS
INTRAVENOUS | Status: AC
  Filled 2015-10-03: qty 20

## 2015-10-03 SURGICAL SUPPLY — 22 items

## 2015-10-03 NOTE — Progress Notes (Signed)
[facility] PROGRESS NOTE  Elizabeth Daniels JQZ:[id number] DOB: October 13, 1931 DOA: 09/30/2015 PCP: No primary care provider on file.   Assessment/Plan: #1 GI bleed Likely lower GI bleed patient with history of diverticulosis. Patient with no bleeding overnight. Thus far, patient is status post 2 units packed red blood cells. Hemoglobin currently at 8.8. Continue on PPI. Plavix on hold since admission. Patient is being followed by gastroenterology and patient underwent colonoscopy 12/7 with findings of moderate diverticulosis in the sigmoid colon and mild diverticulosis in the transverse colon and ascending colon. GI recs to hold ASA and other NSAIDS x 2 weeks with high fiber diet and liberal fluid intake with follow up with Dr. Ardis Hughs PRN  #2 acute blood loss anemia Secondary to problem #1. Patient status post 2 units packed red blood cells on 09/30/2015. H&H stable. Hemoglobin currently at 8.8  #3 syncope Likely vasovagal secondary to #1. No further syncopal episodes. Hemoglobin stable at 8.8.   #4 hypertension Patient's oral hypertensive medications were held on admission due to syncope and GI bleed. Cont home regimen of labetalol 300 mg 3 times daily.   #5 acute on chronic kidney disease stage III Likely secondary to prerenal azotemia in the setting of diuretics. Renal function improved and currently at baseline.   #6 peripheral vascular disease Plavix initially held secondary to problem #1. OK to resume plavix per GI.  #7 history of gout Stable. Resume allopurinol.  #8 history of glaucoma Resume eyedrops.  #9 gastroesophageal reflux disease PPI.  #10 history of CHF Stable. Compensated. Diuretics were held. Monitor volume status closely.  #11 abnormal CT CT with colonic wall thickening. Questionable etiology. GI following.  #12 moderate malnutrition Nutrition following  #13 prophylaxis PPI for GI prophylaxis. SCDs for DVT prophylaxis.  Code Status: Full Family  Communication: Pt in room Disposition Plan: Pending   Consultants:  GI  Pulmonary  Procedures:  Colonoscopy 12/7  Antibiotics: Anti-infectives    None      HPI/Subjective: No complaints this AM  Objective: Filed Vitals:   10/03/15 1409 10/03/15 1510 10/03/15 1512 10/03/15 1520  BP: 211/73 149/58  171/60  Pulse: 83  84 83  Temp: 98.6 F (37 C)  97.7 F (36.5 C)   TempSrc:   Oral   Resp: '20  21 15  '$ Height:      Weight:      SpO2: 100%  100% 100%    Intake/Output Summary (Last 24 hours) at 10/03/15 1811 Last data filed at 10/03/15 1700  Gross per 24 hour  Intake    740 ml  Output      0 ml  Net    740 ml   Filed Weights   10/01/15 0400 10/02/15 0336 10/03/15 1000  Weight: 57.8 kg (127 lb 6.8 oz) 57.7 kg (127 lb 3.3 oz) 57.7 kg (127 lb 3.3 oz)    Exam:   General:  Awake, in nad  Cardiovascular: regular, s1, s2  Respiratory: normal resp effort, no wheezing  Abdomen: soft,nondistended  Musculoskeletal: perfused, no clubbing   Data Reviewed: Basic Metabolic Panel:  Recent Labs Lab 09/30/15 0803 10/01/15 0522 10/02/15 0817 10/03/15 0335  NA 142 144 142 144  K 4.0 3.9 3.8 3.8  CL 113* 117* 116* 119*  CO2 22 18* 18* 18*  GLUCOSE 95 83 113* 86  BUN 54* 42* 35* 33*  CREATININE 1.55* 1.31* 1.19* 1.27*  CALCIUM 9.5 9.1 9.1 9.0  MG  --  1.5* 2.1  --   PHOS  --  3.1  --   --    Liver Function Tests:  Recent Labs Lab 09/30/15 0803  AST 11*  ALT 6*  ALKPHOS 57  BILITOT 0.4  PROT 6.0*  ALBUMIN 2.8*   No results for input(s): LIPASE, AMYLASE in the last 168 hours. No results for input(s): AMMONIA in the last 168 hours. CBC:  Recent Labs Lab 09/30/15 0803  10/01/15 0522 10/01/15 1120 10/01/15 1810 10/01/15 2331 10/02/15 0601 10/03/15 0335  WBC 3.9*  --  9.1  --   --   --   --  6.2  NEUTROABS 2.6  --   --   --   --   --   --   --   HGB 9.7*  < > 10.2* 10.4* 10.8* 10.5* 9.9* 8.8*  HCT 30.5*  < > 31.0* 31.4* 32.8* 31.9* 30.0*  26.7*  MCV 101.3*  --  94.8  --   --   --   --  97.1  PLT 200  --  158  --   --   --   --  152  < > = values in this interval not displayed. Cardiac Enzymes: No results for input(s): CKTOTAL, CKMB, CKMBINDEX, TROPONINI in the last 168 hours. BNP (last 3 results) No results for input(s): BNP in the last 8760 hours.  ProBNP (last 3 results) No results for input(s): PROBNP in the last 8760 hours.  CBG:  Recent Labs Lab 10/01/15 2046  GLUCAP 89    Recent Results (from the past 240 hour(s))  MRSA PCR Screening     Status: Abnormal   Collection Time: 09/30/15  1:08 PM  Result Value Ref Range Status   MRSA by PCR POSITIVE (A) NEGATIVE Final    Comment:        The GeneXpert MRSA Assay (FDA approved for NASAL specimens only), is one component of a comprehensive MRSA colonization surveillance program. It is not intended to diagnose MRSA infection nor to guide or monitor treatment for MRSA infections. RESULT CALLED TO, READ BACK BY AND VERIFIED WITH: Bethann Humble RN @ 1939 ON 09/30/15 BY C DAVIS      Studies: No results found.  Scheduled Meds: . allopurinol  100 mg Oral Daily  . antiseptic oral rinse  7 mL Mouth Rinse BID  . Chlorhexidine Gluconate Cloth  6 each Topical Q0600  . cholecalciferol  1,000 Units Oral Daily  . dorzolamide-timolol  1 drop Both Eyes BID  . feeding supplement  1 Container Oral TID BM  . gabapentin  300 mg Oral TID  . labetalol  300 mg Oral TID  . latanoprost  1 drop Both Eyes QHS  . mupirocin ointment  1 application Nasal BID   Continuous Infusions:   Principal Problem:   GI bleed Active Problems:   Hematochezia   Abnormal CT scan, colon   Malnutrition of moderate degree   HTN (hypertension)   Syncope   Acute blood loss anemia   Acute kidney injury superimposed on CKD (HCC)   GERD (gastroesophageal reflux disease)   Glaucoma   Gout   PVD (peripheral vascular disease) (HCC)   Dysphagia   Generalized anxiety disorder   CHF (congestive  heart failure) ([place])    Elizabeth, [facility] Pager [phone]. If 7PM-7AM, please contact night-coverage at [email or web address], password [facility] 10/03/2015, 6:11 PM  LOS: 3 days

## 2015-10-03 NOTE — Transfer of Care (Signed)
Immediate Anesthesia Transfer of Care Note  Patient: Elizabeth Daniels  Procedure(s) Performed: Procedure(s): COLONOSCOPY WITH PROPOFOL (N/A)  Patient Location: PACU and Endoscopy Unit  Anesthesia Type:MAC  Level of Consciousness: awake, oriented, lethargic and responds to stimulation  Airway & Oxygen Therapy: Patient Spontanous Breathing and Patient connected to face mask oxygen  Post-op Assessment: Report given to RN, Post -op Vital signs reviewed and stable and Patient moving all extremities  Post vital signs: Reviewed and stable  Last Vitals:  Filed Vitals:   10/03/15 1158 10/03/15 1409  BP:  211/73  Pulse:  83  Temp: 36.9 C 37 C  Resp:  20    Complications: No apparent anesthesia complications

## 2015-10-03 NOTE — Progress Notes (Signed)
OT Cancellation Note  Patient Details Name: Elizabeth Daniels MRN: [id number] DOB: 10/19/1931   Cancelled Treatment:    Reason Eval/Treat Not Completed: Other (comment).  Pt is a LTC resident at [facility]; will defer OT evaluation in acute setting.  OT at SNF can screen for change in status.  Baylea Milburn 10/03/2015, 11:05 AM  Lesle Chris, OTR/L [phone] 10/03/2015

## 2015-10-03 NOTE — Op Note (Signed)
[facility] [place], [address]   COLONOSCOPY PROCEDURE REPORT  PATIENT: Elizabeth Daniels, Elizabeth Daniels  MR#: [id number] BIRTHDATE: 1931/04/10 , 60  yrs. old GENDER: female ENDOSCOPIST: Ladene Artist, MD, [facility] REFERRED PY:[facility] PROCEDURE DATE:  10/03/2015 PROCEDURE:   Colonoscopy, diagnostic First Screening Colonoscopy - Avg.  risk and is 50 yrs.  old or older - No.  Prior Negative Screening - Now for repeat screening. N/A  History of Adenoma - Now for follow-up colonoscopy & has been > or = to 3 yrs.  N/A  Polyps removed today? No Recommend repeat exam, <10 yrs? No ASA CLASS:   Class III INDICATIONS:Evaluation of unexplained GI bleeding-hematochezia and abnormal CT of the colon. MEDICATIONS: Monitored anesthesia care and Per Anesthesia DESCRIPTION OF PROCEDURE:   After the risks benefits and alternatives of the procedure were thoroughly explained, informed consent was obtained.  The digital rectal exam revealed no abnormalities of the rectum.   The Pentax Ped Colon [id number] endoscope was introduced through the anus and advanced to the cecum, which was identified by both the appendix and ileocecal valve. No adverse events experienced.   Limited by a tortuous and redundant colon.   The quality of the prep was fair.  (MoviPrep was used)  The instrument was then slowly withdrawn as the colon was fully examined. Estimated blood loss is zero unless otherwise noted in this procedure report.  COLON FINDINGS: There was moderate diverticulosis noted in the sigmoid colon with associated muscular hypertrophy, colonic spasm and luminal narrowing.   There was mild diverticulosis noted in the transverse colon and ascending colon.   The examination was otherwise normal.  Retroflexed views revealed no abnormalities. The time to cecum = 5.7 Withdrawal time = 14.1   The scope was withdrawn and the procedure completed. COMPLICATIONS: There were no immediate  complications.  ENDOSCOPIC IMPRESSION: 1.   Moderate diverticulosis in the sigmoid colon 2.   Mild diverticulosis in the transverse colon and ascending colon  3.   The examination was otherwise normal however limted by a fair prep and tortuous/redundant colon  RECOMMENDATIONS: 1.  Hold Aspirin and all other NSAIDS for 2 weeks. 2.  High fiber diet with liberal fluid intake. 3.  GI follow up with Dr. Ardis Hughs as needed 4.  OK to resume Plavix in 3 days as clinically indiciated  eSigned:  Ladene Artist, MD, [facility] 10/03/2015 3:12 PM

## 2015-10-03 NOTE — Anesthesia Postprocedure Evaluation (Signed)
Anesthesia Post Note  Patient: Elizabeth Daniels  Procedure(s) Performed: Procedure(s) (LRB): COLONOSCOPY WITH PROPOFOL (N/A)  Patient location during evaluation: Endoscopy Anesthesia Type: MAC Level of consciousness: awake and alert Pain management: pain level controlled Vital Signs Assessment: post-procedure vital signs reviewed and stable Respiratory status: spontaneous breathing, nonlabored ventilation, respiratory function stable and patient connected to nasal cannula oxygen Cardiovascular status: stable and blood pressure returned to baseline Anesthetic complications: no    Last Vitals:  Filed Vitals:   10/03/15 1512 10/03/15 1520  BP:  171/60  Pulse: 84 83  Temp: 36.5 C   Resp: 21 15    Last Pain:  Filed Vitals:   10/03/15 1521  PainSc: 0-No pain                 Montez Hageman

## 2015-10-03 NOTE — Interval H&P Note (Signed)
History and Physical Interval Note:  10/03/2015 2:33 PM  Elizabeth Daniels  has presented today for surgery, with the diagnosis of GI bleed  The various methods of treatment have been discussed with the patient and family. After consideration of risks, benefits and other options for treatment, the patient has consented to  Procedure(s): COLONOSCOPY WITH PROPOFOL (N/A) as a surgical intervention .  The patient's history has been reviewed, patient examined, no change in status, stable for surgery.  I have reviewed the patient's chart and labs.  Questions were answered to the patient's satisfaction.     Pricilla Riffle. Fuller Plan

## 2015-10-03 NOTE — Progress Notes (Signed)
Patient ID: Elizabeth Daniels, female   DOB: June 16, 1931, 81 y.o.   MRN: [id number]    Progress Note   Subjective  Sleeping- completed prep without difficulty- no complaints HGB 8.8 drifting   Objective   Vital signs in last 24 hours: Temp:  [98.1 F (36.7 C)-99 F (37.2 C)] 98.1 F (36.7 C) (12/07 0334) Pulse Rate:  [65-84] 72 (12/07 0700) Resp:  [12-19] 17 (12/07 0700) BP: (131-185)/(37-90) 145/78 mmHg (12/07 0700) SpO2:  [100 %] 100 % (12/07 0700) Last BM Date: 10/02/15 General:     Elderly AA  female in NAD Heart:  Regular rate and rhythm; no murmurs Lungs: Respirations even and unlabored, lungs CTA bilaterally Abdomen:  Soft, minimally tender RLQ,and nondistended. Normal bowel sounds. Extremities:  Without edema. Neurologic:  Alert and oriented,  grossly normal neurologically. Psych:  Cooperative. Normal mood and affect.  Intake/Output from previous day: 12/06 0701 - 12/07 0700 In: 800 [P.O.:200; I.V.:600] Out: -  Intake/Output this shift:    Lab Results:  Recent Labs  10/01/15 0522  10/01/15 2331 10/02/15 0601 10/03/15 0335  WBC 9.1  --   --   --  6.2  HGB 10.2*  < > 10.5* 9.9* 8.8*  HCT 31.0*  < > 31.9* 30.0* 26.7*  PLT 158  --   --   --  152  < > = values in this interval not displayed. BMET  Recent Labs  10/01/15 0522 10/02/15 0817 10/03/15 0335  NA 144 142 144  K 3.9 3.8 3.8  CL 117* 116* 119*  CO2 18* 18* 18*  GLUCOSE 83 113* 86  BUN 42* 35* 33*  CREATININE 1.31* 1.19* 1.27*  CALCIUM 9.1 9.1 9.0     Assessment / Plan:    #1 81 yo female with acute lower GI bleed in setting of plavix, and abnormal CT with cecal and right colon inflammatory changes. HGB drifting without overt active bleeding Off Plavix x 4 days For colonoscopy today-further plans pending findings Hopefully can advance diet as well #2 HTN #3 Syncope - likely vasovagal with acute bleed #4 CKD #5 PVD  Principal Problem:   GI bleed Active Problems:   Hematochezia  Abnormal CT scan, colon   Malnutrition of moderate degree   HTN (hypertension)   Syncope   Acute blood loss anemia   Acute kidney injury superimposed on CKD (HCC)   GERD (gastroesophageal reflux disease)   Glaucoma   Gout   PVD (peripheral vascular disease) (HCC)   Dysphagia   Generalized anxiety disorder   CHF (congestive heart failure) ([place])    LOS: 3 days   Elizabeth Daniels  10/03/2015, 8:54 AM     Attending physician's note   I have taken an interval history, reviewed the chart and examined the patient. I agree with the Advanced Practitioner's note, impression and recommendations. No recurrent bleeding. Hb drifting, likely equilibrating. Colonoscopy today as planned.  Elizabeth Edward, MD [facility] [phone] Mon-Fri 8a-5p [phone] after 5p, weekends, holidays

## 2015-10-03 NOTE — H&P (View-Only) (Signed)
Patient ID: Elizabeth Daniels, female   DOB: [birth year], 81 y.o.   MRN: [id number]    Progress Note   Subjective  Sleeping- completed prep without difficulty- no complaints HGB 8.8 drifting   Objective   Vital signs in last 24 hours: Temp:  [98.1 F (36.7 C)-99 F (37.2 C)] 98.1 F (36.7 C) (12/07 0334) Pulse Rate:  [65-84] 72 (12/07 0700) Resp:  [12-19] 17 (12/07 0700) BP: (131-185)/(37-90) 145/78 mmHg (12/07 0700) SpO2:  [100 %] 100 % (12/07 0700) Last BM Date: 10/02/15 General:     Elderly AA  female in NAD Heart:  Regular rate and rhythm; no murmurs Lungs: Respirations even and unlabored, lungs CTA bilaterally Abdomen:  Soft, minimally tender RLQ,and nondistended. Normal bowel sounds. Extremities:  Without edema. Neurologic:  Alert and oriented,  grossly normal neurologically. Psych:  Cooperative. Normal mood and affect.  Intake/Output from previous day: 12/06 0701 - 12/07 0700 In: 800 [P.O.:200; I.V.:600] Out: -  Intake/Output this shift:    Lab Results:  Recent Labs  10/01/15 0522  10/01/15 2331 10/02/15 0601 10/03/15 0335  WBC 9.1  --   --   --  6.2  HGB 10.2*  < > 10.5* 9.9* 8.8*  HCT 31.0*  < > 31.9* 30.0* 26.7*  PLT 158  --   --   --  152  < > = values in this interval not displayed. BMET  Recent Labs  10/01/15 0522 10/02/15 0817 10/03/15 0335  NA 144 142 144  K 3.9 3.8 3.8  CL 117* 116* 119*  CO2 18* 18* 18*  GLUCOSE 83 113* 86  BUN 42* 35* 33*  CREATININE 1.31* 1.19* 1.27*  CALCIUM 9.1 9.1 9.0     Assessment / Plan:    #1 81 yo female with acute lower GI bleed in setting of plavix, and abnormal CT with cecal and right colon inflammatory changes. HGB drifting without overt active bleeding Off Plavix x 4 days For colonoscopy today-further plans pending findings Hopefully can advance diet as well #2 HTN #3 Syncope - likely vasovagal with acute bleed #4 CKD #5 PVD  Principal Problem:   GI bleed Active Problems:   Hematochezia  Abnormal CT scan, colon   Malnutrition of moderate degree   HTN (hypertension)   Syncope   Acute blood loss anemia   Acute kidney injury superimposed on CKD (HCC)   GERD (gastroesophageal reflux disease)   Glaucoma   Gout   PVD (peripheral vascular disease) (HCC)   Dysphagia   Generalized anxiety disorder   CHF (congestive heart failure) ([place])    LOS: 3 days   Elizabeth Daniels  10/03/2015, 8:54 AM     Attending physician's note   I have taken an interval history, reviewed the chart and examined the patient. I agree with the Advanced Practitioner's note, impression and recommendations. No recurrent bleeding. Hb drifting, likely equilibrating. Colonoscopy today as planned.  Lucio Edward, MD [facility] [phone] Mon-Fri 8a-5p [phone] after 5p, weekends, holidays

## 2015-10-03 NOTE — Evaluation (Signed)
Physical Therapy Evaluation Patient Details Name: Elizabeth Daniels MRN: [id number] DOB: 08-31-1931 Today's Date: 10/03/2015   History of Present Illness  81 yo female from ALF admitted with GI bleeding. PMH significant for HTN, diverticulosis, R AKA who presents to the hospital with GI bleeding.  While in the ED patient had a syncopal episode, likely vagal and was unresponsive but improved quickly.  Clinical Impression  Pt admitted with above diagnosis. Pt currently with functional limitations due to the deficits listed below (see PT Problem List). Pt will benefit from skilled PT to increase their independence and safety with mobility to allow discharge to the venue listed below. Pt demonstrates weakness and requires max assist for transfers and bed mobility today. Recommend SNF for continuous rehab. No equipment needs indicated at this time.      Follow Up Recommendations SNF;Supervision for mobility/OOB    Equipment Recommendations  None recommended by PT    Recommendations for Other Services       Precautions / Restrictions Precautions Precautions: Fall Precaution Comments: R AKA since young age  Restrictions Weight Bearing Restrictions: No      Mobility  Bed Mobility Overal bed mobility: Needs Assistance;+ 2 for safety/equipment Bed Mobility: Supine to Sit     Supine to sit: Max assist     General bed mobility comments: multiple multi-modal cues for correct technique, pt initiates mobility, however, unable to perform independently, requires max assist for scooting to the EOB   Transfers Overall transfer level: Needs assistance Equipment used: Rolling walker (2 wheeled) Transfers: Sit to/from Stand;Lateral/Scoot Transfers Sit to Stand: Total assist;+2 physical assistance;From elevated surface        Lateral/Scoot Transfers: Max assist;+2 safety/equipment;From elevated surface General transfer comment: multiple cues for use of UE's to assist with trunk, unable to  perform sit to stand, required total assist to be cleaned and replace pad, max assist to lateral scoot to the recliner; pt is weak and decondioned. Oxygen desat to <80% during transfer, but quickly recovered to 95% within 30-45 seconds once sitting  Ambulation/Gait             General Gait Details: pt is not safe to ambulate at tis time   Financial trader Rankin (Stroke Patients Only)       Balance Overall balance assessment: Needs assistance Sitting-balance support: Feet supported;Bilateral upper extremity supported Sitting balance-Leahy Scale: Poor       Standing balance-Leahy Scale: Zero                               Pertinent Vitals/Pain Pain Assessment: No/denies pain    Home Living Family/patient expects to be discharged to:: Assisted living                      Prior Function Level of Independence: Needs assistance   Gait / Transfers Assistance Needed: reports being able to stand pivot transfer, uncertain of accuracy     Comments: Pt is from ALF, unable to provide detailed history at this time      Hand Dominance        Extremity/Trunk Assessment   Upper Extremity Assessment: Generalized weakness           Lower Extremity Assessment: Generalized weakness;RLE deficits/detail RLE Deficits / Details: AKA, pt is able to flex hip against gravity, hip strength at least  3/5     Cervical / Trunk Assessment: Kyphotic  Communication   Communication: No difficulties  Cognition Arousal/Alertness: Awake/alert Behavior During Therapy: WFL for tasks assessed/performed Overall Cognitive Status: Within Functional Limits for tasks assessed                      General Comments      Exercises        Assessment/Plan    PT Assessment Patient needs continued PT services  PT Diagnosis Difficulty walking;Generalized weakness   PT Problem List Decreased strength;Decreased activity  tolerance;Decreased balance;Decreased mobility;Decreased safety awareness;Decreased knowledge of use of DME  PT Treatment Interventions DME instruction;Gait training;Functional mobility training;Therapeutic activities;Therapeutic exercise;Patient/family education   PT Goals (Current goals can be found in the Care Plan section) Acute Rehab PT Goals Patient Stated Goal: to feel better PT Goal Formulation: With patient Time For Goal Achievement: 10/17/15 Potential to Achieve Goals: Good    Frequency Min 3X/week   Barriers to discharge        Co-evaluation               End of Session Equipment Utilized During Treatment: Gait belt Activity Tolerance: Patient limited by fatigue Patient left: in chair;with call bell/phone within reach Nurse Communication: Mobility status         Time: 7169-6789 PT Time Calculation (min) (ACUTE ONLY): 25 min   Charges:   PT Evaluation $Initial PT Evaluation Tier I: 1 Procedure PT Treatments $Therapeutic Activity: 8-22 mins   PT G Codes:        Santina Trillo, SPT Oct 31, 2015, 3:04 PM

## 2015-10-03 NOTE — Anesthesia Preprocedure Evaluation (Addendum)
Anesthesia Evaluation  Patient identified by MRN, date of birth, ID band Patient awake    Reviewed: Allergy & Precautions, NPO status , Patient's Chart, lab work & pertinent test results  Airway Mallampati: II  TM Distance: >3 FB Neck ROM: Full    Dental no notable dental hx. (+) Upper Dentures   Pulmonary former smoker,    Pulmonary exam normal breath sounds clear to auscultation       Cardiovascular hypertension, Pt. on medications + Peripheral Vascular Disease and +CHF  Normal cardiovascular exam Rhythm:Regular Rate:Normal     Neuro/Psych negative neurological ROS  negative psych ROS   GI/Hepatic negative GI ROS, Neg liver ROS,   Endo/Other  negative endocrine ROS  Renal/GU Renal InsufficiencyRenal diseasenegative Renal ROS  negative genitourinary   Musculoskeletal negative musculoskeletal ROS (+)   Abdominal   Peds negative pediatric ROS (+)  Hematology negative hematology ROS (+) anemia ,   Anesthesia Other Findings   Reproductive/Obstetrics negative OB ROS                            Anesthesia Physical Anesthesia Plan  ASA: III  Anesthesia Plan: MAC   Post-op Pain Management:    Induction:   Airway Management Planned: Simple Face Mask and Nasal Cannula  Additional Equipment:   Intra-op Plan:   Post-operative Plan:   Informed Consent: I have reviewed the patients History and Physical, chart, labs and discussed the procedure including the risks, benefits and alternatives for the proposed anesthesia with the patient or authorized representative who has indicated his/her understanding and acceptance.   Dental advisory given  Plan Discussed with: CRNA  Anesthesia Plan Comments:         Anesthesia Quick Evaluation

## 2015-10-04 ENCOUNTER — Encounter (HOSPITAL_COMMUNITY): Payer: Self-pay | Admitting: Gastroenterology

## 2015-10-04 DIAGNOSIS — K5731 Diverticulosis of large intestine without perforation or abscess with bleeding: Principal | ICD-10-CM

## 2015-10-04 LAB — CBC
HCT: 28.6 % — ABNORMAL LOW (ref 36.0–46.0)
Hemoglobin: 9.2 g/dL — ABNORMAL LOW (ref 12.0–15.0)
MCH: 31.2 pg (ref 26.0–34.0)
MCHC: 32.2 g/dL (ref 30.0–36.0)
MCV: 96.9 fL (ref 78.0–100.0)
PLATELETS: 174 10*3/uL (ref 150–400)
RBC: 2.95 MIL/uL — ABNORMAL LOW (ref 3.87–5.11)
RDW: 15.5 % (ref 11.5–15.5)
WBC: 6 10*3/uL (ref 4.0–10.5)

## 2015-10-04 MED ORDER — CLOPIDOGREL BISULFATE 75 MG PO TABS: 75.0000 mg | ORAL_TABLET | Freq: Every day | ORAL | Status: DC

## 2015-10-04 NOTE — NC FL2 (Signed)
[place] LEVEL OF CARE SCREENING TOOL     IDENTIFICATION  Patient Name: Elizabeth Daniels Birthdate: [birth year] Sex: female Admission Date (Current Location): 09/30/2015  [facility] [place] and [place] Number:     Facility and Address:  [facility],  [address], [place]      Provider Number: [id number]  Attending Physician Name and Address:  Donne Hazel, MD  Relative Name and Phone Number:       Current Level of Care: Hospital Recommended Level of Care: Page Prior Approval Number:    Date Approved/Denied:   PASRR Number: [id number]  Discharge Plan: SNF    Current Diagnoses: Patient Active Problem List   Diagnosis Date Noted  . Gastrointestinal hemorrhage with melena   . Malnutrition of moderate degree 10/02/2015  . HTN (hypertension) 10/02/2015  . Syncope 10/02/2015  . Acute blood loss anemia 10/02/2015  . Acute kidney injury superimposed on CKD ([place]) 10/02/2015  . GERD (gastroesophageal reflux disease) 10/02/2015  . Glaucoma 10/02/2015  . Gout 10/02/2015  . PVD (peripheral vascular disease) ([place]) 10/02/2015  . Dysphagia 10/02/2015  . Generalized anxiety disorder 10/02/2015  . CHF (congestive heart failure) ([place]) 10/02/2015  . Hematochezia   . Abnormal CT scan, colon   . GI bleed 09/30/2015    Orientation ACTIVITIES/SOCIAL BLADDER RESPIRATION    Self, Situation, Place  Passive Incontinent Normal  BEHAVIORAL SYMPTOMS/MOOD NEUROLOGICAL BOWEL NUTRITION STATUS   (no beaviors)   Continent  (clear liquids / expect pt to progress )  PHYSICIAN VISITS COMMUNICATION OF NEEDS Height & Weight Skin  Over 180 days Verbally '5\' 6"'$  (167.6 cm) 127 lbs. Normal          AMBULATORY STATUS RESPIRATION      Normal      Personal Care Assistance Level of Assistance  Bathing, Dressing Bathing Assistance: Maximum assistance   Dressing Assistance: Maximum assistance      Functional Limitations Info                 SPECIAL CARE FACTORS FREQUENCY                      Additional Factors Info  Code Status, Allergies, Isolation Precautions, Psychotropic Code Status Info: Full Code       Isolation Precautions Info: MRSA + by pcr 09/30/15 no encounter      Current Medications (10/04/2015):  This is the current hospital active medication list Current Facility-Administered Medications  Medication Dose Route Frequency Provider Last Rate Last Dose  . acetaminophen (TYLENOL) tablet 650 mg  650 mg Oral Q6H PRN Juanito Doom, MD   650 mg at 10/04/15 0138  . allopurinol (ZYLOPRIM) tablet 100 mg  100 mg Oral Daily Eugenie Filler, MD   100 mg at 10/04/15 1204  . antiseptic oral rinse (CPC / CETYLPYRIDINIUM CHLORIDE 0.05%) solution 7 mL  7 mL Mouth Rinse BID Rush Farmer, MD   7 mL at 10/04/15 1205  . Chlorhexidine Gluconate Cloth 2 % PADS 6 each  6 each Topical Q0600 Rush Farmer, MD   6 each at 10/04/15 1205  . cholecalciferol (VITAMIN D) tablet 1,000 Units  1,000 Units Oral Daily Eugenie Filler, MD   1,000 Units at 10/04/15 1205  . dorzolamide-timolol (COSOPT) 22.3-6.8 MG/ML ophthalmic solution 1 drop  1 drop Both Eyes BID Eugenie Filler, MD   1 drop at 10/04/15 1206  . feeding supplement (BOOST / RESOURCE BREEZE) liquid  1 Container  1 Container Oral TID BM Clayton Bibles, RD   1 Container at 10/04/15 1400  . gabapentin (NEURONTIN) capsule 300 mg  300 mg Oral TID Eugenie Filler, MD   300 mg at 10/04/15 1206  . hydrALAZINE (APRESOLINE) injection 10-40 mg  10-40 mg Intravenous Q4H PRN Kara Mead V, MD   20 mg at 10/03/15 1932  . labetalol (NORMODYNE) tablet 300 mg  300 mg Oral TID Eugenie Filler, MD   300 mg at 10/04/15 1204  . labetalol (NORMODYNE,TRANDATE) injection 20 mg  20 mg Intravenous Q10 min PRN Juanito Doom, MD   20 mg at 10/02/15 0824  . latanoprost (XALATAN) 0.005 % ophthalmic solution 1 drop  1 drop Both Eyes QHS Irine Seal V, MD      . mupirocin ointment  (BACTROBAN) 2 % 1 application  1 application Nasal BID Rush Farmer, MD   1 application at 16/38/46 1206  . polyvinyl alcohol (LIQUIFILM TEARS) 1.4 % ophthalmic solution 1 drop  1 drop Both Eyes PRN Eugenie Filler, MD      . promethazine (PHENERGAN) injection 6.25 mg  6.25 mg Intravenous Q15 min PRN Montez Hageman, MD         Discharge Medications: Please see discharge summary for a list of discharge medications.  Relevant Imaging Results:  Relevant Lab Results:  Recent Labs    Additional Information SS # [id number]  Lili Harts, Randall An, LCSW

## 2015-10-04 NOTE — Progress Notes (Addendum)
Patient ID: Elizabeth Daniels, female   DOB: [birth year], 81 y.o.   MRN: [id number]    Progress Note   Subjective  Feels "fine", no complaints, no c/o abdominal pain, no bleeding HGB 9.2   Objective   Vital signs in last 24 hours: Temp:  [97.7 F (36.5 C)-98.7 F (37.1 C)] 98.6 F (37 C) (12/08 0800) Pulse Rate:  [77-97] 77 (12/08 0600) Resp:  [11-27] 14 (12/08 0600) BP: (119-222)/(28-100) 131/37 mmHg (12/08 0600) SpO2:  [89 %-100 %] 100 % (12/08 0600) Weight:  [127 lb 3.3 oz (57.7 kg)] 127 lb 3.3 oz (57.7 kg) (12/07 1000) Last BM Date: 10/02/15 General:    Elderly AA female in NAD Heart:  Regular rate and rhythm; no murmurs Lungs: Respirations even and unlabored, lungs CTA bilaterally Abdomen:  Soft, nontender and nondistended. Normal bowel sounds. Extremities: s/p right AKA Neurologic:  Alert and oriented,  grossly normal neurologically. Psych:  Cooperative. Normal mood and affect.  Intake/Output from previous day: 12/07 0701 - 12/08 0700 In: 490 [I.V.:490] Out: -  Intake/Output this shift:    Lab Results:  Recent Labs  10/02/15 0601 10/03/15 0335 10/04/15 0345  WBC  --  6.2 6.0  HGB 9.9* 8.8* 9.2*  HCT 30.0* 26.7* 28.6*  PLT  --  152 174   BMET  Recent Labs  10/02/15 0817 10/03/15 0335  NA 142 144  K 3.8 3.8  CL 116* 119*  CO2 18* 18*  GLUCOSE 113* 86  BUN 35* 33*  CREATININE 1.19* 1.27*  CALCIUM 9.1 9.0   LFT No results for input(s): PROT, ALBUMIN, AST, ALT, ALKPHOS, BILITOT, BILIDIR, IBILI in the last 72 hours. PT/INR No results for input(s): LABPROT, INR in the last 72 hours.    Assessment / Plan:    #1 81 yo AA female admitted with acute lower GI bleed with syncope- no active bleeding over past few days. Colonoscopy revealed moderate pandiverticulosis  Hgb 9.2 today stable Will advance diet to solid Would leave off Plavix for at least 3 more days, however 2 weeks would be better, from GI standpoint, then ok to resume No ASA/NSAIDs for  2 weeks GI will sign off, available if needed. GI follow with Dr. Ardis Hughs as needed Post hospital follow up with PCP  Principal Problem:   GI bleed Active Problems:   Hematochezia   Abnormal CT scan, colon   Malnutrition of moderate degree   HTN (hypertension)   Syncope   Acute blood loss anemia   Acute kidney injury superimposed on CKD (HCC)   GERD (gastroesophageal reflux disease)   Glaucoma   Gout   PVD (peripheral vascular disease) (HCC)   Dysphagia   Generalized anxiety disorder   CHF (congestive heart failure) (HCC)   Gastrointestinal hemorrhage with melena     LOS: 4 days   [name]  10/04/2015, 8:48 AM     Attending physician's note   I have taken an interval history, reviewed the chart and examined the patient. I agree with the Advanced Practitioner's note, impression and recommendations.   Lucio Edward, MD [facility] [phone] Mon-Fri 8a-5p [phone] after 5p, weekends, holidays

## 2015-10-04 NOTE — Discharge Instructions (Signed)
GI recommendations: -Would leave off Plavix for at least 3 more days, then ok to resume -No ASA/NSAIDs for 2 weeks

## 2015-10-04 NOTE — Progress Notes (Signed)
Patient is stable at discharge. [facility] transported patient to facility.

## 2015-10-04 NOTE — Progress Notes (Signed)
Pt is ready to return to [facility]. Pt/ daughter are in agreement with d/c plan. [facility] transport required. Medical necessity form completed. D/C summary sent to SNF for review prior to d/c.  Werner Lean LCSW [phone]

## 2015-10-04 NOTE — Discharge Summary (Signed)
Physician Discharge Summary  Elizabeth Daniels ATF:[id number] DOB: 23-Sep-1931 DOA: 09/30/2015  PCP: No primary care provider on file.  Admit date: 09/30/2015 Discharge date: 10/04/2015  Time spent: 20 minutes  Recommendations for Outpatient Follow-up:  1. Follow up with PCP in 1-2 weeks 2. Resume plavix on 12/12 3. No ASA/NSAIDs for 2 weeks   Discharge Diagnoses:  Principal Problem:   GI bleed Active Problems:   Hematochezia   Abnormal CT scan, colon   Malnutrition of moderate degree   HTN (hypertension)   Syncope   Acute blood loss anemia   Acute kidney injury superimposed on CKD (HCC)   GERD (gastroesophageal reflux disease)   Glaucoma   Gout   PVD (peripheral vascular disease) (HCC)   Dysphagia   Generalized anxiety disorder   CHF (congestive heart failure) (HCC)   Gastrointestinal hemorrhage with melena   Discharge Condition: Stable  Diet recommendation: Soft  Filed Weights   10/01/15 0400 10/02/15 0336 10/03/15 1000  Weight: 57.8 kg (127 lb 6.8 oz) 57.7 kg (127 lb 3.3 oz) 57.7 kg (127 lb 3.3 oz)    History of present illness:  Please review dictated H and P from 12/4 for details. Briefly, 81 year old female with PMH of HTN and diverticulosis who presents to the hospital with GI bleeding. While in the ED patient had a syncopal episode, likely vagal and was unresponsive but improved quickly. Starting noticing blood earlier today in her stool. While in the hospital had another episode of bloody stool that caused her to become syncopal that recovered within 10 minutes. Patient was admitted for further work up.  Hospital Course:  #1 GI bleed Likely lower GI bleed patient with history of diverticulosis. Thus far, patient is status post 2 units packed red blood cells. Hemoglobin currently up to 9.2. Continued on PPI. Plavix was on hold since admission. Patient is being followed by gastroenterology and patient underwent colonoscopy 12/7 with findings of moderate  diverticulosis in the sigmoid colon and mild diverticulosis in the transverse colon and ascending colon. GI recs to hold ASA and other NSAIDS x 2 weeks with high fiber diet and liberal fluid intake with follow up with Dr. Ardis Hughs PRN. Also recs to hold Plavix for at least 3 days (resume on 12/12)  #2 acute blood loss anemia Secondary to problem #1. Patient status post 2 units packed red blood cells on 09/30/2015. H&H remained stable. Hemoglobin 9.2 on d/c  #3 syncope Likely vasovagal secondary to #1. No further syncopal episodes.   #4 hypertension Patient's oral hypertensive medications were held on admission due to syncope and GI bleed. Cont home regimen of labetalol 300 mg 3 times daily.   #5 acute on chronic kidney disease stage III Likely secondary to prerenal azotemia in the setting of diuretics. Renal function improved and currently at baseline.   #6 peripheral vascular disease Plavix initially held secondary to problem #1. OK to resume plavix after at least 3 days per GI.  #7 history of gout Stable. Resumed allopurinol.  #8 history of glaucoma Resume eyedrops.  #9 gastroesophageal reflux disease PPI.  #10 history of CHF Stable. Compensated. Diuretics were held. Monitor volume status closely.  #11 abnormal CT CT with colonic wall thickening. Questionable etiology. GI following.  #12 moderate malnutrition Nutrition following  #13 prophylaxis PPI for GI prophylaxis. SCDs for DVT prophylaxis.    Consultations:  Critical Care  GI  Discharge Exam: Filed Vitals:   10/04/15 0600 10/04/15 0800 10/04/15 0900 10/04/15 1000  BP: 131/37  140/63  170/44  Pulse: 77 78 84 80  Temp:  98.6 F (37 C)    TempSrc:  Oral    Resp: '14 18 16 11  '$ Height:      Weight:      SpO2: 100% 100% 100% 100%    General: awake, in nad Cardiovascular: regular, s1, s2 Respiratory: normal resp effort, no wheezing  Discharge Instructions     Medication List    TAKE these medications         allopurinol 100 MG tablet  Commonly known as:  ZYLOPRIM  Take 100 mg by mouth daily.     cholecalciferol 1000 UNITS tablet  Commonly known as:  VITAMIN D  Take 1,000 Units by mouth daily.     clopidogrel 75 MG tablet  Commonly known as:  PLAVIX  Take 1 tablet (75 mg total) by mouth daily.  Start taking on:  10/08/2015     DEXILANT 30 MG capsule  Generic drug:  Dexlansoprazole  Take 30 mg by mouth daily.     dorzolamide-timolol 22.3-6.8 MG/ML ophthalmic solution  Commonly known as:  COSOPT  Place 1 drop into both eyes 2 (two) times daily.     epoetin alfa 10000 UNIT/ML injection  Commonly known as:  EPOGEN,PROCRIT  Inject 10,000 Units into the skin once a week.     feeding supplement (PRO-STAT SUGAR FREE 64) Liqd  Take 30 mLs by mouth 2 (two) times daily.     gabapentin 300 MG capsule  Commonly known as:  NEURONTIN  Take 300 mg by mouth 3 (three) times daily.     gabapentin 100 MG capsule  Commonly known as:  NEURONTIN  Take 100 mg by mouth 2 (two) times daily.     hydrALAZINE 50 MG tablet  Commonly known as:  APRESOLINE  Take 50 mg by mouth 3 (three) times daily.     HYDROcodone-acetaminophen 5-325 MG tablet  Commonly known as:  NORCO/VICODIN  Take 1 tablet by mouth every 6 (six) hours as needed for moderate pain.     iron polysaccharides 150 MG capsule  Commonly known as:  NIFEREX  Take 150 mg by mouth daily.     labetalol 300 MG tablet  Commonly known as:  NORMODYNE  Take 300 mg by mouth 3 (three) times daily.     latanoprost 0.005 % ophthalmic solution  Commonly known as:  XALATAN  1 drop at bedtime.     mirtazapine 7.5 MG tablet  Commonly known as:  REMERON  Take 7.5 mg by mouth at bedtime.     polyethylene glycol packet  Commonly known as:  MIRALAX / GLYCOLAX  Take 17 g by mouth daily.     polyvinyl alcohol 1.4 % ophthalmic solution  Commonly known as:  LIQUIFILM TEARS  Place 1 drop into both eyes as needed for dry eyes.      promethazine 12.5 MG tablet  Commonly known as:  PHENERGAN  Take 12.5 mg by mouth every 6 (six) hours as needed for nausea or vomiting.     torsemide 10 MG tablet  Commonly known as:  DEMADEX  Take 10 mg by mouth daily.       Allergies  Allergen Reactions  . Codeine     MAR  . Quinine Derivatives     MAR   Follow-up Information    Follow up with [facility] In 1 week.   Specialty:  [place]   Why:  Hospital follow up  Contact information:   [address] [place] [phone]       The results of significant diagnostics from this hospitalization (including imaging, microbiology, ancillary and laboratory) are listed below for reference.    Significant Diagnostic Studies: Ct Abdomen Pelvis W Contrast  09/30/2015  CLINICAL DATA:  Abdominal pain and rectal bleeding. EXAM: CT ABDOMEN AND PELVIS WITH CONTRAST TECHNIQUE: Multidetector CT imaging of the abdomen and pelvis was performed using the standard protocol following bolus administration of intravenous contrast. CONTRAST:  87m OMNIPAQUE IOHEXOL 300 MG/ML SOLN, 854mOMNIPAQUE IOHEXOL 300 MG/ML SOLN COMPARISON:  None. FINDINGS: Lower chest: There are two 4 mm left lower lobe pulmonary nodules (series 4/images 1 and 2). Mild cardiomegaly. Right coronary atherosclerosis. Hepatobiliary: Normal liver with no liver mass. Gallbladder contains a gas containing partially calcified 2.3 cm gallstone. Likely additional layering tiny subcentimeter gallstones. No gallbladder wall thickening or pericholecystic fat stranding. No biliary ductal dilatation. Pancreas: Normal, with no mass or duct dilation. Spleen: Normal size. No mass. Adrenals/Urinary Tract: There is a 1.1 cm hypodense right adrenal nodule (series 5/ image 45), with indeterminate density. Normal left adrenal. No hydronephrosis. There are 3 small simple right renal cysts measuring up to 1.4 cm in size. There are several  subcentimeter hypodense lesions in both kidneys, which are too small to characterize. Normal bladder. Stomach/Bowel: Small hiatal hernia. Otherwise grossly normal stomach. There is a small to moderate left inguinal hernia containing small bowel loops, with no small bowel wall thickening, focal small bowel caliber transition or small bowel pneumatosis. Normal caliber small bowel with no small bowel wall thickening. Appendix is not discretely visualized. There is circumferential wall thickening and mucosal hyperenhancement throughout the cecum and ascending colon, with associated pericolonic fat stranding and ill-defined fluid. There is mild sigmoid diverticulosis, with no colonic wall thickening or pericolonic fat stranding in association with the sigmoid diverticula. There is circumferential wall thickening and hyper enhancement with mild surrounding fat stranding at the anorectal junction. Vascular/Lymphatic: Atherosclerotic nonaneurysmal abdominal aorta. Patent portal, splenic, hepatic and renal veins. No pathologically enlarged lymph nodes in the abdomen or pelvis. Reproductive: Grossly normal uterus.  No adnexal mass. Other: No pneumoperitoneum. Trace free fluid in the pelvis. Small fat containing umbilical hernia. Small fat containing right inguinal hernia. Musculoskeletal: Marked degenerative changes in the visualized thoracolumbar spine. No aggressive appearing focal osseous lesions. IMPRESSION: 1. Circumferential wall thickening, mucosal hyperenhancement and pericolonic fat stranding in the cecum and ascending colon. Circumferential wall thickening and wall hyper enhancement at the anorectal junction. These are nonspecific findings that could represent an infectious or inflammatory proctocolitis, with the differential including C diff colitis, although an underlying neoplasm cannot be excluded and correlation with colonoscopy is advised when clinically feasible if not recently performed. 2. Mild sigmoid  diverticulosis. No colonic wall thickening in association with the areas of diverticulosis. 3. No evidence of bowel perforation or abscess. 4. Small moderate left inguinal hernia containing small bowel loops, with no evidence of small-bowel obstruction or ischemia. Small fat containing umbilical and right inguinal hernias. 5. Two left lower lobe pulmonary nodules, largest 4 mm. If the patient is at high risk for bronchogenic carcinoma, follow-up chest CT at 1 year is recommended. If the patient is at low risk, no follow-up is needed. This recommendation follows the consensus statement: Guidelines for Management of Small Pulmonary Nodules Detected on CT Scans: A Statement from the [employer] published in Radiology 2005; 237:395-400. 6. Cholelithiasis. 7. Small indeterminate right adrenal nodule, probably  a benign adenoma. Correlate with follow-up unenhanced CT abdomen or MRI abdomen in 12 months. Electronically Signed   By: Ilona Sorrel M.D.   On: 09/30/2015 09:40    Microbiology: Recent Results (from the past 240 hour(s))  MRSA PCR Screening     Status: Abnormal   Collection Time: 09/30/15  1:08 PM  Result Value Ref Range Status   MRSA by PCR POSITIVE (A) NEGATIVE Final    Comment:        The GeneXpert MRSA Assay (FDA approved for NASAL specimens only), is one component of a comprehensive MRSA colonization surveillance program. It is not intended to diagnose MRSA infection nor to guide or monitor treatment for MRSA infections. RESULT CALLED TO, READ BACK BY AND VERIFIED WITH: Bethann Humble RN @ 1939 ON 09/30/15 BY C DAVIS      Labs: Basic Metabolic Panel:  Recent Labs Lab 09/30/15 0803 10/01/15 0522 10/02/15 0817 10/03/15 0335  NA 142 144 142 144  K 4.0 3.9 3.8 3.8  CL 113* 117* 116* 119*  CO2 22 18* 18* 18*  GLUCOSE 95 83 113* 86  BUN 54* 42* 35* 33*  CREATININE 1.55* 1.31* 1.19* 1.27*  CALCIUM 9.5 9.1 9.1 9.0  MG  --  1.5* 2.1  --   PHOS  --  3.1  --   --    Liver  Function Tests:  Recent Labs Lab 09/30/15 0803  AST 11*  ALT 6*  ALKPHOS 57  BILITOT 0.4  PROT 6.0*  ALBUMIN 2.8*   No results for input(s): LIPASE, AMYLASE in the last 168 hours. No results for input(s): AMMONIA in the last 168 hours. CBC:  Recent Labs Lab 09/30/15 0803  10/01/15 0522  10/01/15 1810 10/01/15 2331 10/02/15 0601 10/03/15 0335 10/04/15 0345  WBC 3.9*  --  9.1  --   --   --   --  6.2 6.0  NEUTROABS 2.6  --   --   --   --   --   --   --   --   HGB 9.7*  < > 10.2*  < > 10.8* 10.5* 9.9* 8.8* 9.2*  HCT 30.5*  < > 31.0*  < > 32.8* 31.9* 30.0* 26.7* 28.6*  MCV 101.3*  --  94.8  --   --   --   --  97.1 96.9  PLT 200  --  158  --   --   --   --  152 174  < > = values in this interval not displayed. Cardiac Enzymes: No results for input(s): CKTOTAL, CKMB, CKMBINDEX, TROPONINI in the last 168 hours. BNP: BNP (last 3 results) No results for input(s): BNP in the last 8760 hours.  ProBNP (last 3 results) No results for input(s): PROBNP in the last 8760 hours.  CBG:  Recent Labs Lab 10/01/15 2046  GLUCAP 89     Signed:  Brendia Dampier K  [facility] 10/04/2015, 2:05 PM

## 2015-11-30 ENCOUNTER — Other Ambulatory Visit: Payer: Self-pay | Admitting: Internal Medicine

## 2015-11-30 DIAGNOSIS — Z1231 Encounter for screening mammogram for malignant neoplasm of breast: Secondary | ICD-10-CM

## 2015-12-10 ENCOUNTER — Ambulatory Visit
Admission: RE | Admit: 2015-12-10 | Discharge: 2015-12-10 | Disposition: A | Discharge status: Home / Self Care | Payer: Medicare Other | Source: Ambulatory Visit | Attending: Internal Medicine | Admitting: Internal Medicine

## 2015-12-10 DIAGNOSIS — Z1231 Encounter for screening mammogram for malignant neoplasm of breast: Secondary | ICD-10-CM

## 2017-02-09 ENCOUNTER — Other Ambulatory Visit: Payer: Self-pay | Admitting: Internal Medicine

## 2017-02-09 DIAGNOSIS — Z1231 Encounter for screening mammogram for malignant neoplasm of breast: Secondary | ICD-10-CM

## 2017-02-12 ENCOUNTER — Ambulatory Visit: Payer: Medicare Other

## 2017-03-20 ENCOUNTER — Ambulatory Visit (INDEPENDENT_AMBULATORY_CARE_PROVIDER_SITE_OTHER): Payer: Medicare Other | Admitting: Physician Assistant

## 2017-03-20 ENCOUNTER — Encounter: Payer: Self-pay | Admitting: Physician Assistant

## 2017-03-20 VITALS — HR 75

## 2017-03-20 DIAGNOSIS — R1013 Epigastric pain: Secondary | ICD-10-CM

## 2017-03-20 DIAGNOSIS — R630 Anorexia: Secondary | ICD-10-CM | POA: Diagnosis not present

## 2017-03-20 MED ORDER — PANTOPRAZOLE SODIUM 40 MG PO TBEC
40.0000 mg | DELAYED_RELEASE_TABLET | Freq: Two times a day (BID) | ORAL | 3 refills | Status: DC
Start: 2017-03-20 — End: 2017-05-18

## 2017-03-20 NOTE — Progress Notes (Signed)
Chief Complaint: Abdominal pain, nausea and vomiting  HPI:  Elizabeth Daniels is an 83 year old African-American female with a past medical history of dysphagia, GERD, generalized anxiety disorder, hypertension, lung cancer,PVD as well as others listed below, who presents to clinic today from a nursing home, as a new patient with a complaint of abdominal pain, nausea and vomiting.   Patient has followed distantly in our clinic with Dr. Ardis Hughs while in the hospital, but did have a colonoscopy with Dr. Fuller Plan 10/03/15 for evaluation of unexplained GI bleeding and hematochezia as well as an abnormal CT of the colon. There were findings of moderate diverticulosis in the sigmoid colon as well as mild diverticulosis in the transverse colon and ascending colon. Exam was otherwise normal however limited by fair prep and tortuous/redundant colon.   Today, the patient presents to clinic accompanied by her daughter who does assist with her history. Apparently the patient lives in an assisted living facility but her daughter is with her quite frequently. The daughter explains that "my mom has always had a sensitive stomach", and describes episodes of nausea and vomiting over the past few years, but apparently these have worsened and become more consistent over the past couple of months. The patient tells me this may happen 1-2 times a week and does not necessarily happen after she eats. Sometimes it is just bile. Other times it is immediately after she eats. Patient also describes an intermittent "mid stomach" pain that is rated as a 9/10 at its worst that is sometimes somewhat relieved with vomiting and then "sitting down for a while". Patient tells me this happens a couple of times a week. Per daughter patient has had a decreased appetite over the past year and has been losing weight. Patient is on Dexilant 30 mg per her MAR, but per daughter this was Omeprazole in the past, she is unsure when it was changed. Patient  gets stomach upset with an Ensure/dairy containing supplements and the daughter has already requested a nondairy supplement.   Patient's bowel movements are completely normal having one every other day or so which is formed.   Patient denies fever, chills, blood in her stool, melena, change in diet, heartburn, reflux, dysphagia or symptoms that awaken her at night  Past Medical History:  Diagnosis Date  . Anemia   . CHF (congestive heart failure) ([place]) 10/02/2015  . Dysphagia 10/02/2015  . Generalized anxiety disorder 10/02/2015  . GERD (gastroesophageal reflux disease) 10/02/2015  . Glaucoma 10/02/2015  . Gout 10/02/2015  . Hypertension   . Lung cancer ([place])   . PVD (peripheral vascular disease) ([place]) 10/02/2015    Past Surgical History:  Procedure Laterality Date  . COLONOSCOPY WITH PROPOFOL N/A 10/03/2015   Procedure: COLONOSCOPY WITH PROPOFOL;  Surgeon: Ladene Artist, MD;  Location: [facility] ENDOSCOPY;  Service: Endoscopy;  Laterality: N/A;  . right leg amputation     07/2014    Current Outpatient Prescriptions  Medication Sig Dispense Refill  . allopurinol (ZYLOPRIM) 100 MG tablet Take 100 mg by mouth daily.    . Amino Acids-Protein Hydrolys (FEEDING SUPPLEMENT, PRO-STAT SUGAR FREE 64,) LIQD Take 30 mLs by mouth 2 (two) times daily.    . cholecalciferol (VITAMIN D) 1000 UNITS tablet Take 1,000 Units by mouth daily.    . clopidogrel (PLAVIX) 75 MG tablet Take 1 tablet (75 mg total) by mouth daily.    Marland Kitchen Dexlansoprazole (DEXILANT) 30 MG capsule Take 30 mg by mouth daily.    . dorzolamide-timolol (COSOPT)  22.3-6.8 MG/ML ophthalmic solution Place 1 drop into both eyes 2 (two) times daily.    Marland Kitchen epoetin alfa (EPOGEN,PROCRIT) 27782 UNIT/ML injection Inject 10,000 Units into the skin once a week.    . gabapentin (NEURONTIN) 100 MG capsule Take 100 mg by mouth 2 (two) times daily.    Marland Kitchen gabapentin (NEURONTIN) 300 MG capsule Take 300 mg by mouth 3 (three) times daily.    . hydrALAZINE (APRESOLINE)  50 MG tablet Take 50 mg by mouth 3 (three) times daily.    Marland Kitchen HYDROcodone-acetaminophen (NORCO/VICODIN) 5-325 MG tablet Take 1 tablet by mouth every 6 (six) hours as needed for moderate pain.    . iron polysaccharides (NIFEREX) 150 MG capsule Take 150 mg by mouth daily.    Marland Kitchen labetalol (NORMODYNE) 300 MG tablet Take 300 mg by mouth 3 (three) times daily.    Marland Kitchen latanoprost (XALATAN) 0.005 % ophthalmic solution 1 drop at bedtime.    . polyethylene glycol (MIRALAX / GLYCOLAX) packet Take 17 g by mouth daily.    . polyvinyl alcohol (LIQUIFILM TEARS) 1.4 % ophthalmic solution Place 1 drop into both eyes as needed for dry eyes.    . promethazine (PHENERGAN) 12.5 MG tablet Take 12.5 mg by mouth every 6 (six) hours as needed for nausea or vomiting.    . torsemide (DEMADEX) 10 MG tablet Take 10 mg by mouth daily.    . mirtazapine (REMERON) 7.5 MG tablet Take 7.5 mg by mouth at bedtime.    . pantoprazole (PROTONIX) 40 MG tablet Take 1 tablet (40 mg total) by mouth 2 (two) times daily before a meal. 60 tablet 3   No current facility-administered medications for this visit.     Allergies as of 03/20/2017 - Review Complete 10/03/2015  Allergen Reaction Noted  . Codeine  09/30/2015  . Quinine derivatives  09/30/2015    Family History  Problem Relation Age of Onset  . Lung cancer Brother     Social History   Social History  . Marital status: Single    Spouse name: N/A  . Number of children: N/A  . Years of education: N/A   Occupational History  . Not on file.   Social History Main Topics  . Smoking status: Former Research scientist (life sciences)  . Smokeless tobacco: Never Used  . Alcohol use No  . Drug use: No  . Sexual activity: Not Currently   Other Topics Concern  . Not on file   Social History Narrative  . No narrative on file    Review of Systems:    Constitutional: No fever or chills Skin: No rash  Cardiovascular: No chest pain Respiratory: No SOB Gastrointestinal: See HPI and otherwise  negative Genitourinary: No dysuria  Neurological: No headache Musculoskeletal: No new muscle or joint pain Hematologic: No bleeding  Psychiatric: Positive history of anxiety   Physical Exam:  Vital signs: Pulse 75   SpO2 97%    Constitutional:   Pleasant Elderly African American female appears to be in NAD, Well developed, Well nourished, alert and cooperative, in wheelchair Head:  Normocephalic and atraumatic. Eyes:   PEERL, EOMI. No icterus. Conjunctiva pink. Ears:  Normal auditory acuity. Neck:  Supple Throat: Oral cavity and pharynx without inflammation, swelling or lesion.  Respiratory: Respirations even and unlabored. Lungs clear to auscultation bilaterally.   No wheezes, crackles, or rhonchi.  Cardiovascular: Normal S1, S2. No MRG. Regular rate and rhythm. No peripheral edema, cyanosis or pallor.  Gastrointestinal:  Soft, nondistended, nontender. No rebound or guarding. Normal  bowel sounds. No appreciable masses or hepatomegaly. Rectal:  Not performed.  Msk:  Wheelchair, Right BKA, Arthritic changes of hands and arms.  Neurologic:  Alert and  oriented x4;  grossly normal neurologically.  Skin:   Dry and intact without significant lesions or rashes. Psychiatric: Demonstrates good judgement and reason without abnormal affect or behaviors.  No recent labs or imaging.  Assessment: 1. Epigastric abdominal pain: Patient describes at least 2 episodes a week of 9/10 pain which feels somewhat better if she vomits; Consider Gastritis vs PUD vs other 2. Dyspepsia: Patient has episodes of nausea and vomiting weekly typically associated with her epigastric pain; consider gastritis versus GERD versus other 3. Decreased appetite: Likely due to the above and possibly increasing age and leading to weight loss  Plan: 1. At this time recommend that we try conservative measures first to see if we can help the patient's dyspepsia and weight loss as well as epigastric pain. The family agrees. 2.  Stopped patient's Dexilant 30 mg daily and changed patient to Pantoprazole 40 mg twice daily, 30-60 minutes before eating breakfast and dinner 3. Reviewed antireflux diet and lifestyle modifications. 4. Encouraged the patient to find a non-dairy supplement to be taken 3 times daily with meals 5. Patient to follow in clinic in 2-3 weeks with me or Dr. Ardis Hughs. At that time, if patient has not improved on medication, recommend a CT abdomen and pelvis for further investigation.  Ellouise Newer, PA-C [facility] 03/20/2017, 3:23 PM

## 2017-03-20 NOTE — Patient Instructions (Signed)
Stop Dexilant.   Start pantoprazole 40 mg twice a day.

## 2017-03-22 NOTE — Progress Notes (Signed)
I agree with the above note, plan 

## 2017-04-14 ENCOUNTER — Ambulatory Visit: Payer: Medicare Other | Admitting: Physician Assistant

## 2017-05-18 ENCOUNTER — Encounter (INDEPENDENT_AMBULATORY_CARE_PROVIDER_SITE_OTHER): Payer: Self-pay

## 2017-05-18 ENCOUNTER — Encounter: Payer: Self-pay | Admitting: Gastroenterology

## 2017-05-18 ENCOUNTER — Ambulatory Visit (INDEPENDENT_AMBULATORY_CARE_PROVIDER_SITE_OTHER): Payer: Medicare Other | Admitting: Gastroenterology

## 2017-05-18 VITALS — BP 90/56 | HR 88 | Ht 66.0 in | Wt 98.0 lb

## 2017-05-18 DIAGNOSIS — R1013 Epigastric pain: Secondary | ICD-10-CM | POA: Diagnosis not present

## 2017-05-18 DIAGNOSIS — R112 Nausea with vomiting, unspecified: Secondary | ICD-10-CM

## 2017-05-18 MED ORDER — PANTOPRAZOLE SODIUM 40 MG PO TBEC: 40.0000 mg | DELAYED_RELEASE_TABLET | Freq: Two times a day (BID) | ORAL | 3 refills | Status: AC

## 2017-05-18 NOTE — Progress Notes (Signed)
    History of Present Illness: This is an 83 year old female patient of Dr. Ardis Hughs returning for follow-up of epigastric pain, N/V. Pt was inadvertently scheduled with me today. Patient evaluated by Elizabeth Daniels, PAC in May for abdominal pain, nausea and vomiting felt secondary to gastritis, ulcer disease or other process. She was changed to pantoprazole 40 mg twice daily and instructed on standard antireflux measures. She was advised to follow up with Ms. Elizabeth Daniels or Dr. Ardis Hughs. She returns today with her daughter. Her symptoms have substantially improved with only one episode of vomiting that they recall since her last visit however the patient has significant memory impairment and the daughter is not with her at all times. Patient's appetite is not good. She has been using nutritional supplements in between meals. Her current medication list does not include pantoprazole.   Current Medications, Allergies, Past Medical History, Past Surgical History, Family History and Social History were reviewed in [employer] record.  Physical Exam: General: Well developed, well nourished, thin, elderly, frail, in a wheel chair, no acute distress Head: Normocephalic and atraumatic Eyes:  sclerae anicteric, EOMI Ears: Normal auditory acuity Mouth: No deformity or lesions Lungs: Clear throughout to auscultation Heart: Regular rate and rhythm; no murmurs, rubs or bruits Abdomen: Soft, non tender and non distended. No masses, hepatosplenomegaly or hernias noted. Normal Bowel sounds Musculoskeletal: Symmetrical with no gross deformities  Pulses:  Normal pulses noted Extremities: No clubbing, cyanosis, edema or deformities noted Neurological: Alert oriented x 4, grossly nonfocal Psychological:  Alert and cooperative. Normal mood and affect  Assessment and Recommendations:  1. Epigastric pain, nausea and vomiting. All symptoms improved. GERD, gastritis and ulcer disease are all likely  possibilities however the etiology is not clear. Consider abd/pelvic CT, EGD if symptoms return. Will clarify with [facility] that she should be taking pantoprazole 40 mg twice daily. Return office visit with Dr. Ardis Hughs 3 months.

## 2017-05-18 NOTE — Patient Instructions (Signed)
Please take Pantoprazole 40 mg, twice a day.  Follow up with Dr. Ardis Hughs in 3 months.  Thank you for choosing me and [facility].  Pricilla Riffle. Dagoberto Ligas., MD., [facility]

## 2017-05-22 ENCOUNTER — Emergency Department (HOSPITAL_COMMUNITY): Payer: Medicare Other

## 2017-05-22 ENCOUNTER — Inpatient Hospital Stay (HOSPITAL_COMMUNITY)
Admission: EM | Admit: 2017-05-22 | Discharge: 2017-05-25 | DRG: 177 | Disposition: A | Discharge status: Skilled Nursing Facility | Payer: Medicare Other | Attending: Internal Medicine | Admitting: Internal Medicine

## 2017-05-22 ENCOUNTER — Encounter (HOSPITAL_COMMUNITY): Payer: Self-pay | Admitting: Emergency Medicine

## 2017-05-22 DIAGNOSIS — Z7902 Long term (current) use of antithrombotics/antiplatelets: Secondary | ICD-10-CM

## 2017-05-22 DIAGNOSIS — Z923 Personal history of irradiation: Secondary | ICD-10-CM

## 2017-05-22 DIAGNOSIS — I739 Peripheral vascular disease, unspecified: Secondary | ICD-10-CM | POA: Diagnosis present

## 2017-05-22 DIAGNOSIS — N189 Chronic kidney disease, unspecified: Secondary | ICD-10-CM | POA: Diagnosis not present

## 2017-05-22 DIAGNOSIS — I5033 Acute on chronic diastolic (congestive) heart failure: Secondary | ICD-10-CM | POA: Diagnosis present

## 2017-05-22 DIAGNOSIS — I130 Hypertensive heart and chronic kidney disease with heart failure and stage 1 through stage 4 chronic kidney disease, or unspecified chronic kidney disease: Secondary | ICD-10-CM | POA: Diagnosis present

## 2017-05-22 DIAGNOSIS — K219 Gastro-esophageal reflux disease without esophagitis: Secondary | ICD-10-CM | POA: Diagnosis present

## 2017-05-22 DIAGNOSIS — N390 Urinary tract infection, site not specified: Secondary | ICD-10-CM | POA: Diagnosis not present

## 2017-05-22 DIAGNOSIS — Z66 Do not resuscitate: Secondary | ICD-10-CM | POA: Diagnosis present

## 2017-05-22 DIAGNOSIS — F411 Generalized anxiety disorder: Secondary | ICD-10-CM | POA: Diagnosis present

## 2017-05-22 DIAGNOSIS — I509 Heart failure, unspecified: Secondary | ICD-10-CM

## 2017-05-22 DIAGNOSIS — Z8673 Personal history of transient ischemic attack (TIA), and cerebral infarction without residual deficits: Secondary | ICD-10-CM

## 2017-05-22 DIAGNOSIS — Z79891 Long term (current) use of opiate analgesic: Secondary | ICD-10-CM

## 2017-05-22 DIAGNOSIS — L8990 Pressure ulcer of unspecified site, unspecified stage: Secondary | ICD-10-CM | POA: Insufficient documentation

## 2017-05-22 DIAGNOSIS — Z888 Allergy status to other drugs, medicaments and biological substances status: Secondary | ICD-10-CM

## 2017-05-22 DIAGNOSIS — L89152 Pressure ulcer of sacral region, stage 2: Secondary | ICD-10-CM | POA: Diagnosis present

## 2017-05-22 DIAGNOSIS — R400 Somnolence: Secondary | ICD-10-CM

## 2017-05-22 DIAGNOSIS — I340 Nonrheumatic mitral (valve) insufficiency: Secondary | ICD-10-CM | POA: Diagnosis not present

## 2017-05-22 DIAGNOSIS — R55 Syncope and collapse: Secondary | ICD-10-CM | POA: Diagnosis present

## 2017-05-22 DIAGNOSIS — N179 Acute kidney failure, unspecified: Secondary | ICD-10-CM | POA: Diagnosis present

## 2017-05-22 DIAGNOSIS — R1310 Dysphagia, unspecified: Secondary | ICD-10-CM | POA: Diagnosis present

## 2017-05-22 DIAGNOSIS — Z89611 Acquired absence of right leg above knee: Secondary | ICD-10-CM

## 2017-05-22 DIAGNOSIS — E440 Moderate protein-calorie malnutrition: Secondary | ICD-10-CM | POA: Diagnosis present

## 2017-05-22 DIAGNOSIS — M109 Gout, unspecified: Secondary | ICD-10-CM | POA: Diagnosis present

## 2017-05-22 DIAGNOSIS — R0602 Shortness of breath: Secondary | ICD-10-CM

## 2017-05-22 DIAGNOSIS — Z681 Body mass index (BMI) 19 or less, adult: Secondary | ICD-10-CM

## 2017-05-22 DIAGNOSIS — Z801 Family history of malignant neoplasm of trachea, bronchus and lung: Secondary | ICD-10-CM

## 2017-05-22 DIAGNOSIS — F0390 Unspecified dementia without behavioral disturbance: Secondary | ICD-10-CM | POA: Diagnosis present

## 2017-05-22 DIAGNOSIS — J690 Pneumonitis due to inhalation of food and vomit: Principal | ICD-10-CM | POA: Diagnosis present

## 2017-05-22 DIAGNOSIS — Z79899 Other long term (current) drug therapy: Secondary | ICD-10-CM | POA: Diagnosis not present

## 2017-05-22 DIAGNOSIS — N183 Chronic kidney disease, stage 3 (moderate): Secondary | ICD-10-CM | POA: Diagnosis present

## 2017-05-22 DIAGNOSIS — Z87891 Personal history of nicotine dependence: Secondary | ICD-10-CM

## 2017-05-22 DIAGNOSIS — E872 Acidosis: Secondary | ICD-10-CM | POA: Diagnosis present

## 2017-05-22 DIAGNOSIS — Z8701 Personal history of pneumonia (recurrent): Secondary | ICD-10-CM

## 2017-05-22 DIAGNOSIS — Z885 Allergy status to narcotic agent status: Secondary | ICD-10-CM

## 2017-05-22 DIAGNOSIS — Z85118 Personal history of other malignant neoplasm of bronchus and lung: Secondary | ICD-10-CM

## 2017-05-22 DIAGNOSIS — I10 Essential (primary) hypertension: Secondary | ICD-10-CM | POA: Diagnosis present

## 2017-05-22 DIAGNOSIS — H409 Unspecified glaucoma: Secondary | ICD-10-CM | POA: Diagnosis present

## 2017-05-22 DIAGNOSIS — Z7401 Bed confinement status: Secondary | ICD-10-CM

## 2017-05-22 LAB — COMPREHENSIVE METABOLIC PANEL
ALK PHOS: 51 U/L (ref 38–126)
AST: 20 U/L (ref 15–41)
Albumin: 2.9 g/dL — ABNORMAL LOW (ref 3.5–5.0)
Anion gap: 7 (ref 5–15)
BILIRUBIN TOTAL: 0.7 mg/dL (ref 0.3–1.2)
BUN: 58 mg/dL — ABNORMAL HIGH (ref 6–20)
CALCIUM: 9.3 mg/dL (ref 8.9–10.3)
CHLORIDE: 112 mmol/L — AB (ref 101–111)
CO2: 21 mmol/L — ABNORMAL LOW (ref 22–32)
CREATININE: 1.96 mg/dL — AB (ref 0.44–1.00)
GFR, EST AFRICAN AMERICAN: 26 mL/min — AB (ref >60)
GFR, EST NON AFRICAN AMERICAN: 22 mL/min — AB (ref >60)
Glucose, Bld: 92 mg/dL (ref 65–99)
Potassium: 5.4 mmol/L — ABNORMAL HIGH (ref 3.5–5.1)
Sodium: 140 mmol/L (ref 135–145)
TOTAL PROTEIN: 6.5 g/dL (ref 6.5–8.1)

## 2017-05-22 LAB — URINALYSIS, ROUTINE W REFLEX MICROSCOPIC
Bilirubin Urine: NEGATIVE
GLUCOSE, UA: NEGATIVE mg/dL
Hgb urine dipstick: NEGATIVE
KETONES UR: NEGATIVE mg/dL
LEUKOCYTES UA: NEGATIVE
NITRITE: NEGATIVE
PROTEIN: 30 mg/dL — AB
Specific Gravity, Urine: 1.013 (ref 1.005–1.030)
pH: 5 (ref 5.0–8.0)

## 2017-05-22 LAB — CBC WITH DIFFERENTIAL/PLATELET
Basophils Absolute: 0 10*3/uL (ref 0.0–0.1)
Basophils Relative: 0 %
EOS ABS: 0 10*3/uL (ref 0.0–0.7)
EOS PCT: 1 %
HEMATOCRIT: 36.1 % (ref 36.0–46.0)
Hemoglobin: 11.8 g/dL — ABNORMAL LOW (ref 12.0–15.0)
Lymphocytes Relative: 15 %
Lymphs Abs: 0.8 10*3/uL (ref 0.7–4.0)
MCH: 31.6 pg (ref 26.0–34.0)
MCHC: 32.7 g/dL (ref 30.0–36.0)
MCV: 96.5 fL (ref 78.0–100.0)
Monocytes Absolute: 0.4 10*3/uL (ref 0.1–1.0)
Monocytes Relative: 7 %
NEUTROS PCT: 77 %
Neutro Abs: 3.9 10*3/uL (ref 1.7–7.7)
Platelets: 216 10*3/uL (ref 150–400)
RBC: 3.74 MIL/uL — AB (ref 3.87–5.11)
RDW: 18.1 % — ABNORMAL HIGH (ref 11.5–15.5)
WBC: 5 10*3/uL (ref 4.0–10.5)

## 2017-05-22 LAB — BLOOD GAS, VENOUS
Acid-base deficit: 4.4 mmol/L — ABNORMAL HIGH (ref 0.0–2.0)
Bicarbonate: 21.9 mmol/L (ref 20.0–28.0)
O2 SAT: 35.5 %
PCO2 VEN: 48.2 mmHg (ref 44.0–60.0)
Patient temperature: 98.6
pH, Ven: 7.279 (ref 7.250–7.430)

## 2017-05-22 LAB — I-STAT CHEM 8, ED
BUN: 74 mg/dL — ABNORMAL HIGH (ref 6–20)
CALCIUM ION: 1.24 mmol/L (ref 1.15–1.40)
CHLORIDE: 109 mmol/L (ref 101–111)
Creatinine, Ser: 1.8 mg/dL — ABNORMAL HIGH (ref 0.44–1.00)
Glucose, Bld: 88 mg/dL (ref 65–99)
HCT: 41 % (ref 36.0–46.0)
HEMOGLOBIN: 13.9 g/dL (ref 12.0–15.0)
Potassium: 5.8 mmol/L — ABNORMAL HIGH (ref 3.5–5.1)
SODIUM: 140 mmol/L (ref 135–145)
TCO2: 22 mmol/L (ref 0–100)

## 2017-05-22 LAB — I-STAT CG4 LACTIC ACID, ED: Lactic Acid, Venous: 2.13 mmol/L (ref 0.5–1.9)

## 2017-05-22 LAB — MRSA PCR SCREENING: MRSA by PCR: NEGATIVE

## 2017-05-22 LAB — AMMONIA: AMMONIA: 24 umol/L (ref 9–35)

## 2017-05-22 MED ORDER — HYDRALAZINE HCL 25 MG PO TABS
25.0000 mg | ORAL_TABLET | Freq: Three times a day (TID) | ORAL | Status: DC
  Administered 2017-05-23 – 2017-05-25 (×7): 25 mg via ORAL
  Filled 2017-05-22 (×8): qty 1

## 2017-05-22 MED ORDER — ENOXAPARIN SODIUM 30 MG/0.3ML ~~LOC~~ SOLN
30.0000 mg | SUBCUTANEOUS | Status: DC
  Administered 2017-05-23 – 2017-05-25 (×3): 30 mg via SUBCUTANEOUS
  Filled 2017-05-22 (×3): qty 0.3

## 2017-05-22 MED ORDER — TRAMADOL HCL 50 MG PO TABS
50.0000 mg | ORAL_TABLET | Freq: Every day | ORAL | Status: DC
  Administered 2017-05-23 – 2017-05-24 (×3): 50 mg via ORAL
  Filled 2017-05-22 (×3): qty 1

## 2017-05-22 MED ORDER — DOCUSATE SODIUM 100 MG PO CAPS
100.0000 mg | ORAL_CAPSULE | Freq: Every day | ORAL | Status: DC
  Administered 2017-05-23 – 2017-05-25 (×4): 100 mg via ORAL
  Filled 2017-05-22 (×4): qty 1

## 2017-05-22 MED ORDER — LATANOPROST 0.005 % OP SOLN
1.0000 [drp] | Freq: Every day | OPHTHALMIC | Status: DC
  Administered 2017-05-23 – 2017-05-24 (×3): 1 [drp] via OPHTHALMIC
  Filled 2017-05-22: qty 2.5

## 2017-05-22 MED ORDER — IPRATROPIUM-ALBUTEROL 0.5-2.5 (3) MG/3ML IN SOLN: 3.0000 mL | RESPIRATORY_TRACT | Status: DC | PRN

## 2017-05-22 MED ORDER — GABAPENTIN 100 MG PO CAPS
100.0000 mg | ORAL_CAPSULE | Freq: Two times a day (BID) | ORAL | Status: DC
  Administered 2017-05-22 – 2017-05-25 (×6): 100 mg via ORAL
  Filled 2017-05-22 (×6): qty 1

## 2017-05-22 MED ORDER — POLYVINYL ALCOHOL 1.4 % OP SOLN
1.0000 [drp] | Freq: Two times a day (BID) | OPHTHALMIC | Status: DC
  Administered 2017-05-23 – 2017-05-25 (×6): 1 [drp] via OPHTHALMIC
  Filled 2017-05-22: qty 15

## 2017-05-22 MED ORDER — HYDROCODONE-ACETAMINOPHEN 5-325 MG PO TABS
1.0000 | ORAL_TABLET | Freq: Four times a day (QID) | ORAL | Status: DC | PRN
  Administered 2017-05-23 – 2017-05-24 (×2): 1 via ORAL
  Filled 2017-05-22 (×3): qty 1

## 2017-05-22 MED ORDER — MIRTAZAPINE 15 MG PO TABS
15.0000 mg | ORAL_TABLET | Freq: Every day | ORAL | Status: DC
  Administered 2017-05-22 – 2017-05-24 (×3): 15 mg via ORAL
  Filled 2017-05-22 (×3): qty 1

## 2017-05-22 MED ORDER — LABETALOL HCL 200 MG PO TABS
300.0000 mg | ORAL_TABLET | Freq: Three times a day (TID) | ORAL | Status: DC
  Administered 2017-05-23 – 2017-05-25 (×8): 300 mg via ORAL
  Filled 2017-05-22 (×8): qty 1

## 2017-05-22 MED ORDER — PANTOPRAZOLE SODIUM 40 MG PO TBEC
40.0000 mg | DELAYED_RELEASE_TABLET | Freq: Two times a day (BID) | ORAL | Status: DC
  Administered 2017-05-23 – 2017-05-25 (×6): 40 mg via ORAL
  Filled 2017-05-22 (×6): qty 1

## 2017-05-22 MED ORDER — DONEPEZIL HCL 10 MG PO TABS
10.0000 mg | ORAL_TABLET | Freq: Every day | ORAL | Status: DC
  Administered 2017-05-23 – 2017-05-24 (×3): 10 mg via ORAL
  Filled 2017-05-22 (×3): qty 1

## 2017-05-22 MED ORDER — CLOPIDOGREL BISULFATE 75 MG PO TABS
75.0000 mg | ORAL_TABLET | Freq: Every day | ORAL | Status: DC
  Administered 2017-05-23 – 2017-05-25 (×4): 75 mg via ORAL
  Filled 2017-05-22 (×4): qty 1

## 2017-05-22 MED ORDER — ALLOPURINOL 100 MG PO TABS
100.0000 mg | ORAL_TABLET | Freq: Every day | ORAL | Status: DC
  Administered 2017-05-23 – 2017-05-25 (×4): 100 mg via ORAL
  Filled 2017-05-22 (×4): qty 1

## 2017-05-22 MED ORDER — ACETAMINOPHEN 500 MG PO TABS: 500.0000 mg | ORAL_TABLET | Freq: Four times a day (QID) | ORAL | Status: DC | PRN

## 2017-05-22 MED ORDER — POLYETHYLENE GLYCOL 3350 17 G PO PACK
17.0000 g | PACK | Freq: Every day | ORAL | Status: DC
  Administered 2017-05-23 – 2017-05-25 (×4): 17 g via ORAL
  Filled 2017-05-22 (×4): qty 1

## 2017-05-22 MED ORDER — PIPERACILLIN-TAZOBACTAM IN DEX 2-0.25 GM/50ML IV SOLN
2.2500 g | Freq: Three times a day (TID) | INTRAVENOUS | Status: DC
  Administered 2017-05-22 – 2017-05-25 (×7): 2.25 g via INTRAVENOUS
  Filled 2017-05-22 (×9): qty 50

## 2017-05-22 MED ORDER — SODIUM CHLORIDE 0.9 % IV BOLUS (SEPSIS)
1000.0000 mL | Freq: Once | INTRAVENOUS | Status: AC
  Administered 2017-05-22: 1000 mL via INTRAVENOUS

## 2017-05-22 NOTE — ED Notes (Signed)
Janeth Rase : [phone]

## 2017-05-22 NOTE — ED Triage Notes (Signed)
Per EMS-nursing facility staff states patient is more confused than normal-not responding to questions-states she has recurrent UTIs an is on antibiotics-EMS states patient became responsive while in route-patient answered MD's questions appropriately

## 2017-05-22 NOTE — H&P (Signed)
History and Physical    Elizabeth Daniels:[id number] DOB: 06-09-1931 DOA: 05/22/2017  Referring MD/NP/PA: EDP PCP:  Patient coming from: SNF  Chief Complaint: Episode of LoC  HPI: Elizabeth Daniels is a 83 y.o. female with medical history significant of moderate dementia, severe malnutrition, congestive heart failure, chronic kidney disease stage III, remote history of lung cancer status post radiation therapy, peripheral vascular disease status post right AKA, history of GI bleed, who just completed a course of ceftriaxone followed by Levaquin at the nursing home yesterday for enterococcal urinary tract infection, was brought to the emergency room today after she had an episode this afternoon where she was reportedly drinking some juice, suddenly her eyes rolled backwards, she dropped the juice and lost consciousness. No overt seizure activity, tongue biting noted per RN at the nursing home, she may have had some twitching last night.  she denies any new complaints apart from some cough which she's had for a few days and aches and pains all over. She denies any fevers or chills.  ED Course: Vital signs stable,labs notable for creatinine of 1.9 and potassium of 5.4 which is up from 1.2 in 2016,  chest x-ray is notable for small pleural effusions and possible right upper lobe pneumonia   Review of Systems: As per HPI otherwise 10 point review of systems negative.   Past Medical History:  Diagnosis Date  . Anemia   . CHF (congestive heart failure) ([place]) 10/02/2015  . Dysphagia 10/02/2015  . Generalized anxiety disorder 10/02/2015  . GERD (gastroesophageal reflux disease) 10/02/2015  . Glaucoma 10/02/2015  . Gout 10/02/2015  . Hypertension   . Lung cancer ([place])   . PVD (peripheral vascular disease) ([place]) 10/02/2015    Past Surgical History:  Procedure Laterality Date  . COLONOSCOPY WITH PROPOFOL N/A 10/03/2015   Procedure: COLONOSCOPY WITH PROPOFOL;  Surgeon: Ladene Artist, MD;   Location: [facility];  Service: Endoscopy;  Laterality: N/A;  . right leg amputation     07/2014     reports that she has quit smoking. She has never used smokeless tobacco. She reports that she does not drink alcohol or use drugs.  Allergies  Allergen Reactions  . Quinine Derivatives Rash    MAR  . Codeine     MAR    Family History  Problem Relation Age of Onset  . Lung cancer Brother      Prior to Admission medications   Medication Sig Start Date End Date Taking? Authorizing Provider  acetaminophen (TYLENOL) 500 MG tablet Take 500 mg by mouth every morning.   Yes [provider]  allopurinol (ZYLOPRIM) 100 MG tablet Take 100 mg by mouth daily.   Yes [provider]  Amino Acids-Protein Hydrolys (FEEDING SUPPLEMENT, PRO-STAT SUGAR FREE 64,) LIQD Take 30 mLs by mouth 2 (two) times daily.   Yes [provider]  amoxicillin-clavulanate (AUGMENTIN) 500-125 MG tablet Take 1 tablet by mouth 3 (three) times daily.   Yes [provider]  cefTRIAXone (ROCEPHIN) IVPB Inject 1 g into the vein once.   Yes [provider]  cholecalciferol (VITAMIN D) 1000 UNITS tablet Take 1,000 Units by mouth daily.   Yes [provider]  clopidogrel (PLAVIX) 75 MG tablet Take 1 tablet (75 mg total) by mouth daily. 10/08/15  Yes Donne Hazel, MD  docusate sodium (COLACE) 100 MG capsule Take 100 mg by mouth daily.   Yes [provider]  donepezil (ARICEPT) 10 MG tablet Take 10 mg  by mouth at bedtime.   Yes [provider]  gabapentin (NEURONTIN) 300 MG capsule Take 300 mg by mouth daily.    Yes [provider]  gabapentin (NEURONTIN) 400 MG capsule Take 400 mg by mouth 2 (two) times daily.   Yes [provider]  hydrALAZINE (APRESOLINE) 50 MG tablet Take 50 mg by mouth 3 (three) times daily.   Yes [provider]  HYDROcodone-acetaminophen (NORCO/VICODIN) 5-325 MG tablet Take 1 tablet by mouth every 6 (six)  hours as needed for moderate pain.   Yes [provider]  ipratropium-albuterol (DUONEB) 0.5-2.5 (3) MG/3ML SOLN Take 3 mLs by nebulization every 4 (four) hours as needed (pneumonia).   Yes [provider]  labetalol (NORMODYNE) 300 MG tablet Take 300 mg by mouth 3 (three) times daily.   Yes [provider]  latanoprost (XALATAN) 0.005 % ophthalmic solution Place 1 drop into both eyes at bedtime.    Yes [provider]  levofloxacin (LEVAQUIN) 250 MG tablet Take 250 mg by mouth as directed. Every sun, tues, thus, fri for infection   Yes [provider]  mirtazapine (REMERON) 30 MG tablet Take 30 mg by mouth at bedtime.   Yes [provider]  ondansetron (ZOFRAN) 4 MG tablet Take 4 mg by mouth every 6 (six) hours as needed for nausea or vomiting.   Yes [provider]  pantoprazole (PROTONIX) 40 MG tablet Take 1 tablet (40 mg total) by mouth 2 (two) times daily. 05/18/17  Yes Ladene Artist, MD  polyethylene glycol [facility]aveling) packet Take 17 g by mouth daily.   Yes [provider]  promethazine (PHENERGAN) 12.5 MG tablet Take 12.5 mg by mouth every 6 (six) hours as needed for nausea or vomiting.   Yes [provider]  Propylene Glycol (SYSTANE BALANCE OP) Apply 1 drop to eye 2 (two) times daily.   Yes [provider]  torsemide (DEMADEX) 10 MG tablet Take 10 mg by mouth daily.   Yes [provider]  traMADol (ULTRAM) 50 MG tablet Take by mouth at bedtime.   Yes [provider]  epoetin alfa (EPOGEN,PROCRIT) 56213 UNIT/ML injection Inject 10,000 Units into the skin once a week.    [provider]    Physical Exam: Vitals:   05/22/17 1431 05/22/17 1514 05/22/17 1647  BP: (!) 151/120 (!) 168/94 (!) 157/78  Pulse: 99 87 91  Resp: 18 (!) 24 15  Temp: 98.9 F (37.2 C)    TempSrc: Oral    SpO2: (!) 89% 99% 98%      Constitutional: This is a very frail elderly  African-American female laying in bed, no distress she is oriented to self only, able to answer simple questions appropriately  Vitals:   05/22/17 1431 05/22/17 1514 05/22/17 1647  BP: (!) 151/120 (!) 168/94 (!) 157/78  Pulse: 99 87 91  Resp: 18 (!) 24 15  Temp: 98.9 F (37.2 C)    TempSrc: Oral    SpO2: (!) 89% 99% 98%   Eyes: PERRL, lids and conjunctivae normal ENMT: Mucous membranes Dry Resp: : Normal respiratory effort, decreased breath sounds at the bases   Cardiovascular: Regular rate and rhythm, no murmurs  Abdomen: no tenderness, no masses palpated.  Bowel sounds positive.  Musculoskeletal:  right above-knee amputation , left lower leg with some hyperpigmentation and scaly changes, thickening of the nails etc.  Skin: no rashes, lesions, ulcers. No induration Neurologic:  Alert, oriented to self, moves all extremities  including her right AKA, no localizing signs cranial nerves intact  Labs on Admission: I have personally reviewed following labs and imaging studies  CBC:  Recent Labs Lab 05/22/17 1440 05/22/17 1446  WBC 5.0  --   NEUTROABS 3.9  --   HGB 11.8* 13.9  HCT 36.1 41.0  MCV 96.5  --   PLT 216  --    Basic Metabolic Panel:  Recent Labs Lab 05/22/17 1440 05/22/17 1446  NA 140 140  K 5.4* 5.8*  CL 112* 109  CO2 21*  --   GLUCOSE 92 88  BUN 58* 74*  CREATININE 1.96* 1.80*  CALCIUM 9.3  --    GFR: Estimated Creatinine Clearance: 16.6 mL/min (A) (by C-G formula based on SCr of 1.8 mg/dL (H)). Liver Function Tests:  Recent Labs Lab 05/22/17 1440  AST 20  ALT <5*  ALKPHOS 51  BILITOT 0.7  PROT 6.5  ALBUMIN 2.9*   No results for input(s): LIPASE, AMYLASE in the last 168 hours.  Recent Labs Lab 05/22/17 1440  AMMONIA 24   Coagulation Profile: No results for input(s): INR, PROTIME in the last 168 hours. Cardiac Enzymes: No results for input(s): CKTOTAL, CKMB, CKMBINDEX, TROPONINI in the last 168 hours. BNP (last 3 results) No results  for input(s): PROBNP in the last 8760 hours. HbA1C: No results for input(s): HGBA1C in the last 72 hours. CBG: No results for input(s): GLUCAP in the last 168 hours. Lipid Profile: No results for input(s): CHOL, HDL, LDLCALC, TRIG, CHOLHDL, LDLDIRECT in the last 72 hours. Thyroid Function Tests: No results for input(s): TSH, T4TOTAL, FREET4, T3FREE, THYROIDAB in the last 72 hours. Anemia Panel: No results for input(s): VITAMINB12, FOLATE, FERRITIN, TIBC, IRON, RETICCTPCT in the last 72 hours. Urine analysis:    Component Value Date/Time   COLORURINE YELLOW 05/22/2017 1554   APPEARANCEUR CLEAR 05/22/2017 1554   LABSPEC 1.013 05/22/2017 1554   PHURINE 5.0 05/22/2017 1554   GLUCOSEU NEGATIVE 05/22/2017 1554   HGBUR NEGATIVE 05/22/2017 1554   BILIRUBINUR NEGATIVE 05/22/2017 1554   KETONESUR NEGATIVE 05/22/2017 1554   PROTEINUR 30 (A) 05/22/2017 1554   UROBILINOGEN 0.2 03/26/2009 1258   NITRITE NEGATIVE 05/22/2017 1554   LEUKOCYTESUR NEGATIVE 05/22/2017 1554   Sepsis Labs: @LABRCNTIP (procalcitonin:4,lacticidven:4) )No results found for this or any previous visit (from the past 240 hour(s)).   Radiological Exams on Admission: Dg Chest 2 View  Result Date: 05/22/2017 CLINICAL DATA:  Altered mental status EXAM: CHEST  2 VIEW COMPARISON:  03/19/2009 FINDINGS: Rotated patient. Small bilateral pleural effusions on the lateral view. Bandlike opacity over the upper spine on lateral view, possibly right upper lobe. Borderline cardiomegaly. Tortuous aorta with enlarged mediastinal silhouette likely augmented by rotation. No pneumothorax. Advanced degenerative changes of the shoulders with bony remodeling of the distal clavicles and superior migration of both humeral heads. IMPRESSION: 1. Small bilateral pleural effusions. Bandlike opacity within the upper lung posteriorly on lateral view, could reflect infiltrate, not well localized on the frontal view possibly right upper lobe. Radiographic  follow-up recommended 2. Tortuous ectatic aorta with mediastinal enlargement, likely augmented by rotation of the patient 3. Presumed chronic deformity of both shoulders Electronically Signed   By: Donavan Foil M.D.   On: 05/22/2017 15:38   Ct Head Wo Contrast  Result Date: 05/22/2017 CLINICAL DATA:  Increased confusion versus baseline, history hypertension, lung cancer, CHF, recurrent UTIs EXAM: CT HEAD WITHOUT CONTRAST TECHNIQUE: Contiguous axial images were obtained from the base of the skull through the vertex without  intravenous contrast. Sagittal and coronal MPR images reconstructed from axial data set. COMPARISON:  None FINDINGS: Brain: Generalized atrophy. Normal ventricular morphology. No midline shift or mass effect. Small vessel chronic ischemic changes of deep cerebral white matter. No intracranial hemorrhage, mass lesion, evidence of acute infarction, or extra-axial fluid collection. Vascular: Significant atherosclerotic calcification of internal carotid and vertebral arteries at skullbase Skull: Intact.  Mild hyperostosis frontalis interna Sinuses/Orbits: Clear Other: N/A IMPRESSION: Atrophy with small vessel chronic ischemic changes of deep cerebral white matter. No acute intracranial abnormalities. Electronically Signed   By: Lavonia Dana M.D.   On: 05/22/2017 16:13    EKG: Independently reviewed. Sinus rhythm, non specific ST T wave changes  Assessment/Plan Principal Problem:   Syncope -Etiology not clear at this time -Unable to check orthostatics since she is bedbound/nonambulatory -Monitor on telemetry, EKG unremarkable -Check 2-D echocardiogram given known history of congestive heart failure -Also BNP since trace pleural effusions noted on chest x-ray -Monitor for bradycardia or arrhythmias given high dose of labetalol that she is on     Suspected Aspiration pneumonia -She has some history of dysphagia  -And with her x-ray findings it's possible that she could've had an  aspiration type event which predisposed her to the above episode  -Empirically treat with IV Zosyn  -Check SLP eval  -Dysphagia 3 diet pending swallow eval     Malnutrition of moderate degree -supplements as tolerated   Acute kidney injury on CK D3 -unclear what her recent baseline is, last labs were from more than 2 years ago, where her creatinine was 1.27 -Clinically does not appear overtly dry -And with small pleural effusions noted on x-ray I'm reluctant to give her any fluids today -We'll check BNP -Hold today's dose of torsemide -Depending on BNP, creatinine, volume status in a.m. potentially restart torsemide -   HTN (hypertension) -On multidrug regimen of hydralazine, high-dose labetalol -We'll continue this, cut down hydralazine dose   Peripheral vascular disease  -status post above-knee amputation on the right    History of CVA -CT head unremarkable and exam nonfocal -Continue Plavix    dementia  -Continue Aricept    DVT prophylaxis: lovenox Code Status: DNR Family Communication: son at bedside  Disposition Plan: SNF in 2-3days Consults called: None  Admission status: inpatient  Domenic Polite MD [facility] Pager [phone]  If 7PM-7AM, please contact night-coverage [email or web address] Password [facility]  05/22/2017, 5:42 PM

## 2017-05-22 NOTE — ED Notes (Signed)
ED TO INPATIENT HANDOFF REPORT  Name/Age/Gender Elizabeth Daniels 83 y.o. female  Home/SNF/Other Home  Chief Complaint Sepsis  Code Status History    Date Active Date Inactive Code Status Order ID Comments User Context   09/30/2015 11:37 AM 10/04/2015  6:45 PM Full Code [id number]  Rush Farmer, MD ED      Level of Care/Admitting Diagnosis ED Disposition    ED Disposition Condition Comment   [facility] Area: [facility] [[id number]]  Level of Care: Telemetry [5]  Admit to tele based on following criteria: Complex arrhythmia (Bradycardia/Tachycardia)  Diagnosis: Syncope [206001]  Admitting Physician: [place], [place]  Attending Physician: Domenic Polite [[id number]]  Estimated length of stay: past midnight tomorrow  Certification:: I certify this patient will need inpatient services for at least 2 midnights  PT Class (Do Not Modify): Inpatient [101]  PT Acc Code (Do Not Modify): Private [1]       Medical History Past Medical History:  Diagnosis Date  . Anemia   . CHF (congestive heart failure) ([place]) 10/02/2015  . Dysphagia 10/02/2015  . Generalized anxiety disorder 10/02/2015  . GERD (gastroesophageal reflux disease) 10/02/2015  . Glaucoma 10/02/2015  . Gout 10/02/2015  . Hypertension   . Lung cancer ([place])   . PVD (peripheral vascular disease) ([place]) 10/02/2015    Allergies Allergies  Allergen Reactions  . Quinine Derivatives Rash    MAR  . Codeine     MAR    IV Location/Drains/Wounds Patient Lines/Drains/Airways Status   Active Line/Drains/Airways    Name:   Placement date:   Placement time:   Site:   Days:   Peripheral IV 05/22/17 Left Antecubital  05/22/17    1440    Antecubital    less than 1   Peripheral IV 05/22/17 Left Wrist  05/22/17    1459    Wrist    less than 1          Labs/Imaging Results for orders placed or performed during the hospital encounter of 05/22/17 (from the past 48 hour(s))  Comprehensive metabolic panel      Status: Abnormal   Collection Time: 05/22/17  2:40 PM  Result Value Ref Range   Sodium 140 135 - 145 mmol/L   Potassium 5.4 (H) 3.5 - 5.1 mmol/L   Chloride 112 (H) 101 - 111 mmol/L   CO2 21 (L) 22 - 32 mmol/L   Glucose, Bld 92 65 - 99 mg/dL   BUN 58 (H) 6 - 20 mg/dL   Creatinine, Ser 1.96 (H) 0.44 - 1.00 mg/dL   Calcium 9.3 8.9 - 10.3 mg/dL   Total Protein 6.5 6.5 - 8.1 g/dL   Albumin 2.9 (L) 3.5 - 5.0 g/dL   AST 20 15 - 41 U/L   ALT <5 (L) 14 - 54 U/L   Alkaline Phosphatase 51 38 - 126 U/L   Total Bilirubin 0.7 0.3 - 1.2 mg/dL   GFR calc non Af Amer 22 (L) >60 mL/min   GFR calc Af Amer 26 (L) >60 mL/min    Comment: (NOTE) The eGFR has been calculated using the CKD EPI equation. This calculation has not been validated in all clinical situations. eGFR's persistently <60 mL/min signify possible Chronic Kidney Disease.    Anion gap 7 5 - 15  CBC WITH DIFFERENTIAL     Status: Abnormal   Collection Time: 05/22/17  2:40 PM  Result Value Ref Range   WBC 5.0 4.0 - 10.5 K/uL  RBC 3.74 (L) 3.87 - 5.11 MIL/uL   Hemoglobin 11.8 (L) 12.0 - 15.0 g/dL   HCT 36.1 36.0 - 46.0 %   MCV 96.5 78.0 - 100.0 fL   MCH 31.6 26.0 - 34.0 pg   MCHC 32.7 30.0 - 36.0 g/dL   RDW 18.1 (H) 11.5 - 15.5 %   Platelets 216 150 - 400 K/uL   Neutrophils Relative % 77 %   Neutro Abs 3.9 1.7 - 7.7 K/uL   Lymphocytes Relative 15 %   Lymphs Abs 0.8 0.7 - 4.0 K/uL   Monocytes Relative 7 %   Monocytes Absolute 0.4 0.1 - 1.0 K/uL   Eosinophils Relative 1 %   Eosinophils Absolute 0.0 0.0 - 0.7 K/uL   Basophils Relative 0 %   Basophils Absolute 0.0 0.0 - 0.1 K/uL  Ammonia     Status: None   Collection Time: 05/22/17  2:40 PM  Result Value Ref Range   Ammonia 24 9 - 35 umol/L  I-Stat CG4 Lactic Acid, ED  (not at  [facility])     Status: Abnormal   Collection Time: 05/22/17  2:45 PM  Result Value Ref Range   Lactic Acid, Venous 2.13 (HH) 0.5 - 1.9 mmol/L   Comment NOTIFIED PHYSICIAN   I-Stat Chem 8, ED      Status: Abnormal   Collection Time: 05/22/17  2:46 PM  Result Value Ref Range   Sodium 140 135 - 145 mmol/L   Potassium 5.8 (H) 3.5 - 5.1 mmol/L   Chloride 109 101 - 111 mmol/L   BUN 74 (H) 6 - 20 mg/dL   Creatinine, Ser 1.80 (H) 0.44 - 1.00 mg/dL   Glucose, Bld 88 65 - 99 mg/dL   Calcium, Ion 1.24 1.15 - 1.40 mmol/L   TCO2 22 0 - 100 mmol/L   Hemoglobin 13.9 12.0 - 15.0 g/dL   HCT 41.0 36.0 - 46.0 %  Blood gas, venous     Status: Abnormal   Collection Time: 05/22/17  2:50 PM  Result Value Ref Range   pH, Ven 7.279 7.250 - 7.430   pCO2, Ven 48.2 44.0 - 60.0 mmHg   pO2, Ven below reportable range 32.0 - 45.0 mmHg    Comment: rbv dan floyd md by angie dunlap rrt rcp on 05/22/17 at 1452   Bicarbonate 21.9 20.0 - 28.0 mmol/L   Acid-base deficit 4.4 (H) 0.0 - 2.0 mmol/L   O2 Saturation 35.5 %   Patient temperature 98.6    Collection site VEIN    Drawn by COLLECTED BY LABORATORY    Sample type VENOUS   Urinalysis, Routine w reflex microscopic (not at [facility])     Status: Abnormal   Collection Time: 05/22/17  3:54 PM  Result Value Ref Range   Color, Urine YELLOW YELLOW   APPearance CLEAR CLEAR   Specific Gravity, Urine 1.013 1.005 - 1.030   pH 5.0 5.0 - 8.0   Glucose, UA NEGATIVE NEGATIVE mg/dL   Hgb urine dipstick NEGATIVE NEGATIVE   Bilirubin Urine NEGATIVE NEGATIVE   Ketones, ur NEGATIVE NEGATIVE mg/dL   Protein, ur 30 (A) NEGATIVE mg/dL   Nitrite NEGATIVE NEGATIVE   Leukocytes, UA NEGATIVE NEGATIVE   RBC / HPF 0-5 0 - 5 RBC/hpf   WBC, UA 0-5 0 - 5 WBC/hpf   Bacteria, UA FEW (A) NONE SEEN   Squamous Epithelial / LPF 0-5 (A) NONE SEEN   Hyaline Casts, UA PRESENT    Dg Chest 2 View  Result Date:  05/22/2017 CLINICAL DATA:  Altered mental status EXAM: CHEST  2 VIEW COMPARISON:  03/19/2009 FINDINGS: Rotated patient. Small bilateral pleural effusions on the lateral view. Bandlike opacity over the upper spine on lateral view, possibly right upper lobe. Borderline cardiomegaly.  Tortuous aorta with enlarged mediastinal silhouette likely augmented by rotation. No pneumothorax. Advanced degenerative changes of the shoulders with bony remodeling of the distal clavicles and superior migration of both humeral heads. IMPRESSION: 1. Small bilateral pleural effusions. Bandlike opacity within the upper lung posteriorly on lateral view, could reflect infiltrate, not well localized on the frontal view possibly right upper lobe. Radiographic follow-up recommended 2. Tortuous ectatic aorta with mediastinal enlargement, likely augmented by rotation of the patient 3. Presumed chronic deformity of both shoulders Electronically Signed   By: Donavan Foil M.D.   On: 05/22/2017 15:38   Ct Head Wo Contrast  Result Date: 05/22/2017 CLINICAL DATA:  Increased confusion versus baseline, history hypertension, lung cancer, CHF, recurrent UTIs EXAM: CT HEAD WITHOUT CONTRAST TECHNIQUE: Contiguous axial images were obtained from the base of the skull through the vertex without intravenous contrast. Sagittal and coronal MPR images reconstructed from axial data set. COMPARISON:  None FINDINGS: Brain: Generalized atrophy. Normal ventricular morphology. No midline shift or mass effect. Small vessel chronic ischemic changes of deep cerebral white matter. No intracranial hemorrhage, mass lesion, evidence of acute infarction, or extra-axial fluid collection. Vascular: Significant atherosclerotic calcification of internal carotid and vertebral arteries at skullbase Skull: Intact.  Mild hyperostosis frontalis interna Sinuses/Orbits: Clear Other: N/A IMPRESSION: Atrophy with small vessel chronic ischemic changes of deep cerebral white matter. No acute intracranial abnormalities. Electronically Signed   By: Lavonia Dana M.D.   On: 05/22/2017 16:13    Pending Labs [employer]    Start     Ordered   05/22/17 1751  Troponin I (q 6hr x 3)  Now then every 6 hours,   R     05/22/17 1750   05/22/17 1425  Blood Culture  (routine x 2)  BLOOD CULTURE X 2,   STAT     05/22/17 1425   05/22/17 1425  Urine culture  STAT,   STAT     05/22/17 1425   Signed and Held  CBC  (enoxaparin (LOVENOX)    CrCl < 30 ml/min)  Once,   R    Comments:  Baseline for enoxaparin therapy IF NOT ALREADY DRAWN.  Notify MD if PLT < 100 K.    Signed and Held   Signed and Held  Creatinine, serum  (enoxaparin (LOVENOX)    CrCl < 30 ml/min)  Once,   R    Comments:  Baseline for enoxaparin therapy IF NOT ALREADY DRAWN.    Signed and Held   Signed and Held  Creatinine, serum  (enoxaparin (LOVENOX)    CrCl < 30 ml/min)  Weekly,   R    Comments:  while on enoxaparin therapy.    Signed and Held   Signed and Held  CBC  Tomorrow morning,   R     Signed and Held   Signed and Held  Basic metabolic panel  Tomorrow morning,   R     Signed and Held      Isolation Precautions No active isolations  Vitals/Pain Today's Vitals   05/22/17 1440 05/22/17 1514 05/22/17 1647 05/22/17 1830  BP:  (!) 168/94 (!) 157/78 (!) 142/65  Pulse:  87 91 89  Resp:  (!) 24 15 12   Temp:  TempSrc:      SpO2:  99% 98% 94%  PainSc: 7        Medications Medications  sodium chloride 0.9 % bolus 1,000 mL (1,000 mLs Intravenous New Bag/Given 05/22/17 1457)    Mobility walks

## 2017-05-22 NOTE — ED Notes (Signed)
Bed: RESA Expected date:  Expected time:  Means of arrival:  Comments: EMS-sepsis

## 2017-05-22 NOTE — ED Notes (Signed)
In/Out cath, assisted by Caryl Comes, RN

## 2017-05-22 NOTE — ED Provider Notes (Signed)
[facility] DEPT Provider Note   CSN: [id number] Arrival date & time: 05/22/17  1412     History   Chief Complaint Chief Complaint  Patient presents with  . Urinary Tract Infection    HPI Elizabeth Daniels is a 83 y.o. female.  83 yo F with a chief complaint of weakness. Going on for the past week or so. Patient is in an advanced nursing unit where she was recently diagnosed with a urinary tract infection was started on Rocephin and then transitioned to [place]. During this time. The patient was also diagnosed with pneumonia. She is being renally dosed for her Levaquin. There is a Level 5 caveat for dementia. Per EMS the patient became unresponsive this morning. En route to the ED she awoke as they're trying to start an IV. She is alert and able to talk with me but unable to discuss her recent care.   The history is provided by the patient.  Illness  This is a new problem. The current episode started more than 1 week ago. The problem occurs constantly. The problem has not changed since onset.Pertinent negatives include no chest pain, no headaches and no shortness of breath. Nothing aggravates the symptoms. Nothing relieves the symptoms.    Past Medical History:  Diagnosis Date  . Anemia   . CHF (congestive heart failure) ([place]) 10/02/2015  . Dysphagia 10/02/2015  . Generalized anxiety disorder 10/02/2015  . GERD (gastroesophageal reflux disease) 10/02/2015  . Glaucoma 10/02/2015  . Gout 10/02/2015  . Hypertension   . Lung cancer ([place])   . PVD (peripheral vascular disease) ([place]) 10/02/2015    Patient Active Problem List   Diagnosis Date Noted  . Pressure injury of skin 05/23/2017  . Aspiration pneumonia ([place]) 05/22/2017  . Gastrointestinal hemorrhage with melena   . Malnutrition of moderate degree 10/02/2015  . HTN (hypertension) 10/02/2015  . Syncope 10/02/2015  . Acute blood loss anemia 10/02/2015  . Acute kidney injury superimposed on CKD ([place]) 10/02/2015  . GERD  (gastroesophageal reflux disease) 10/02/2015  . Glaucoma 10/02/2015  . Gout 10/02/2015  . PVD (peripheral vascular disease) ([place]) 10/02/2015  . Dysphagia 10/02/2015  . Generalized anxiety disorder 10/02/2015  . CHF (congestive heart failure) ([place]) 10/02/2015  . Hematochezia   . Abnormal CT scan, colon   . GI bleed 09/30/2015    Past Surgical History:  Procedure Laterality Date  . COLONOSCOPY WITH PROPOFOL N/A 10/03/2015   Procedure: COLONOSCOPY WITH PROPOFOL;  Surgeon: Ladene Artist, MD;  Location: [facility] END[facility];  Service: Endoscopy;  Laterality: N/A;  . right leg amputation     07/2014    OB History    No data available       Home Medications    Prior to Admission medications   Medication Sig Start Date End Date Taking? Authorizing Provider  acetaminophen (TYLENOL) 500 MG tablet Take 500 mg by mouth every morning.   Yes [provider]  allopurinol (ZYLOPRIM) 100 MG tablet Take 100 mg by mouth daily.   Yes [provider]  Amino Acids-Protein Hydrolys (FEEDING SUPPLEMENT, PRO-STAT SUGAR FREE 64,) LIQD Take 30 mLs by mouth 2 (two) times daily.   Yes [provider]  amoxicillin-clavulanate (AUGMENTIN) 500-125 MG tablet Take 1 tablet by mouth 3 (three) times daily.   Yes [provider]  cefTRIAXone (ROCEPHIN) IVPB Inject 1 g into the vein once.   Yes [provider]  cholecalciferol (VITAMIN D) 1000 UNITS tablet Take 1,000 Units by mouth daily.  Yes [provider]  clopidogrel (PLAVIX) 75 MG tablet Take 1 tablet (75 mg total) by mouth daily. 10/08/15  Yes Donne Hazel, MD  docusate sodium (COLACE) 100 MG capsule Take 100 mg by mouth daily.   Yes [provider]  donepezil (ARICEPT) 10 MG tablet Take 10 mg by mouth at bedtime.   Yes [provider]  gabapentin (NEURONTIN) 300 MG capsule Take 300 mg by mouth daily.    Yes [provider]  gabapentin (NEURONTIN) 400 MG capsule Take 400 mg by  mouth 2 (two) times daily.   Yes [provider]  hydrALAZINE (APRESOLINE) 50 MG tablet Take 50 mg by mouth 3 (three) times daily.   Yes [provider]  HYDROcodone-acetaminophen (NORCO/VICODIN) 5-325 MG tablet Take 1 tablet by mouth every 6 (six) hours as needed for moderate pain.   Yes [provider]  ipratropium-albuterol (DUONEB) 0.5-2.5 (3) MG/3ML SOLN Take 3 mLs by nebulization every 4 (four) hours as needed (pneumonia).   Yes [provider]  labetalol (NORMODYNE) 300 MG tablet Take 300 mg by mouth 3 (three) times daily.   Yes [provider]  latanoprost (XALATAN) 0.005 % ophthalmic solution Place 1 drop into both eyes at bedtime.    Yes [provider]  levofloxacin (LEVAQUIN) 250 MG tablet Take 250 mg by mouth as directed. Every sun, tues, thus, fri for infection   Yes [provider]  mirtazapine (REMERON) 30 MG tablet Take 30 mg by mouth at bedtime.   Yes [provider]  ondansetron (ZOFRAN) 4 MG tablet Take 4 mg by mouth every 6 (six) hours as needed for nausea or vomiting.   Yes [provider]  pantoprazole (PROTONIX) 40 MG tablet Take 1 tablet (40 mg total) by mouth 2 (two) times daily. 05/18/17  Yes Ladene Artist, MD  polyethylene glycol [facility] / Floria Raveling) packet Take 17 g by mouth daily.   Yes [provider]  promethazine (PHENERGAN) 12.5 MG tablet Take 12.5 mg by mouth every 6 (six) hours as needed for nausea or vomiting.   Yes [provider]  Propylene Glycol (SYSTANE BALANCE OP) Apply 1 drop to eye 2 (two) times daily.   Yes [provider]  torsemide (DEMADEX) 10 MG tablet Take 10 mg by mouth daily.   Yes [provider]  traMADol (ULTRAM) 50 MG tablet Take by mouth at bedtime.   Yes [provider]  epoetin Elizabeth (EPOGEN,PROCRIT) 63846 UNIT/ML injection Inject 10,000 Units into the skin once a week.    [provider]    Family  History Family History  Problem Relation Age of Onset  . Lung cancer Brother     Social History Social History  Substance Use Topics  . Smoking status: Former Research scientist (life sciences)  . Smokeless tobacco: Never Used  . Alcohol use No     Allergies   Quinine derivatives and Codeine   Review of Systems Review of Systems  Unable to perform ROS: Dementia  Constitutional: Negative for chills and fever.  HENT: Negative for congestion and rhinorrhea.   Eyes: Negative for redness and visual disturbance.  Respiratory: Negative for shortness of breath and wheezing.   Cardiovascular: Negative for chest pain and palpitations.  Gastrointestinal: Negative for nausea and vomiting.  Genitourinary: Negative for dysuria and urgency.  Musculoskeletal: Negative for arthralgias and myalgias.  Skin: Negative for pallor and wound.  Neurological: Negative for dizziness and headaches.     Physical Exam Updated Vital Signs BP  111/62 (BP Location: Right Arm)   Pulse 85   Temp 98.4 F (36.9 C) (Oral)   Resp 16   Ht 5\' 6"  (1.676 m)   Wt 42.7 kg (94 lb 2.2 oz)   SpO2 100%   BMI 15.19 kg/m   Physical Exam  Constitutional: She is oriented to person, place, and time. She appears well-developed and well-nourished. No distress.  HENT:  Head: Normocephalic and atraumatic.  Eyes: Pupils are equal, round, and reactive to light. EOM are normal.  Neck: Normal range of motion. Neck supple.  Cardiovascular: Normal rate and regular rhythm.  Exam reveals no gallop and no friction rub.   No murmur heard. Pulmonary/Chest: Effort normal. She has no wheezes. She has no rales.  Abdominal: Soft. She exhibits no distension and no mass. There is no tenderness. There is no guarding.  Musculoskeletal: She exhibits no edema or tenderness.  Neurological: She is alert and oriented to person, place, and time.  Skin: Skin is warm and dry. She is not diaphoretic.  Psychiatric: She has a normal mood and affect. Her behavior is  normal.  Nursing note and vitals reviewed.    ED Treatments / Results  Labs (all labs ordered are listed, but only abnormal results are displayed) Labs Reviewed  COMPREHENSIVE METABOLIC PANEL - Abnormal; Notable for the following:       Result Value   Potassium 5.4 (*)    Chloride 112 (*)    CO2 21 (*)    BUN 58 (*)    Creatinine, Ser 1.96 (*)    Albumin 2.9 (*)    ALT <5 (*)    GFR calc non Af Amer 22 (*)    GFR calc Af Amer 26 (*)    All other components within normal limits  CBC WITH DIFFERENTIAL/PLATELET - Abnormal; Notable for the following:    RBC 3.74 (*)    Hemoglobin 11.8 (*)    RDW 18.1 (*)    All other components within normal limits  URINALYSIS, ROUTINE W REFLEX MICROSCOPIC - Abnormal; Notable for the following:    Protein, ur 30 (*)    Bacteria, UA FEW (*)    Squamous Epithelial / LPF 0-5 (*)    All other components within normal limits  BLOOD GAS, VENOUS - Abnormal; Notable for the following:    Acid-base deficit 4.4 (*)    All other components within normal limits  TROPONIN I - Abnormal; Notable for the following:    Troponin I 0.21 (*)    All other components within normal limits  TROPONIN I - Abnormal; Notable for the following:    Troponin I 0.18 (*)    All other components within normal limits  CBC - Abnormal; Notable for the following:    RBC 3.26 (*)    Hemoglobin 10.4 (*)    HCT 31.2 (*)    RDW 18.2 (*)    All other components within normal limits  BASIC METABOLIC PANEL - Abnormal; Notable for the following:    CO2 18 (*)    BUN 53 (*)    Creatinine, Ser 1.63 (*)    GFR calc non Af Amer 28 (*)    GFR calc Af Amer 33 (*)    All other components within normal limits  I-STAT CG4 LACTIC ACID, ED - Abnormal; Notable for the following:    Lactic Acid, Venous 2.13 (*)    All other components within normal limits  I-STAT CHEM 8, ED - Abnormal; Notable for the  following:    Potassium 5.8 (*)    BUN 74 (*)    Creatinine, Ser 1.80 (*)    All  other components within normal limits  MRSA PCR SCREENING  CULTURE, BLOOD (ROUTINE X 2)  CULTURE, BLOOD (ROUTINE X 2)  URINE CULTURE  AMMONIA  I-STAT TROPONIN, ED  I-STAT CG4 LACTIC ACID, ED    EKG  EKG Interpretation  Date/Time:  Friday May 22 2017 14:28:35 EDT Ventricular Rate:  90 PR Interval:    QRS Duration: 108 QT Interval:  365 QTC Calculation: 447 R Axis:   -34 Text Interpretation:  Sinus or ectopic atrial rhythm Prolonged PR interval Left axis deviation No significant change since last tracing Confirmed by Deno Etienne [phone]) on 05/22/2017 2:50:28 PM Also confirmed by Deno Etienne [phone]), editor Hattie Perch [phone])  on 05/22/2017 3:07:35 PM       Radiology Dg Chest 2 View  Result Date: 05/22/2017 CLINICAL DATA:  Altered mental status EXAM: CHEST  2 VIEW COMPARISON:  03/19/2009 FINDINGS: Rotated patient. Small bilateral pleural effusions on the lateral view. Bandlike opacity over the upper spine on lateral view, possibly right upper lobe. Borderline cardiomegaly. Tortuous aorta with enlarged mediastinal silhouette likely augmented by rotation. No pneumothorax. Advanced degenerative changes of the shoulders with bony remodeling of the distal clavicles and superior migration of both humeral heads. IMPRESSION: 1. Small bilateral pleural effusions. Bandlike opacity within the upper lung posteriorly on lateral view, could reflect infiltrate, not well localized on the frontal view possibly right upper lobe. Radiographic follow-up recommended 2. Tortuous ectatic aorta with mediastinal enlargement, likely augmented by rotation of the patient 3. Presumed chronic deformity of both shoulders Electronically Signed   By: Donavan Foil M.D.   On: 05/22/2017 15:38   Ct Head Wo Contrast  Result Date: 05/22/2017 CLINICAL DATA:  Increased confusion versus baseline, history hypertension, lung cancer, CHF, recurrent UTIs EXAM: CT HEAD WITHOUT CONTRAST TECHNIQUE: Contiguous axial images were  obtained from the base of the skull through the vertex without intravenous contrast. Sagittal and coronal MPR images reconstructed from axial data set. COMPARISON:  None FINDINGS: Brain: Generalized atrophy. Normal ventricular morphology. No midline shift or mass effect. Small vessel chronic ischemic changes of deep cerebral white matter. No intracranial hemorrhage, mass lesion, evidence of acute infarction, or extra-axial fluid collection. Vascular: Significant atherosclerotic calcification of internal carotid and vertebral arteries at skullbase Skull: Intact.  Mild hyperostosis frontalis interna Sinuses/Orbits: Clear Other: N/A IMPRESSION: Atrophy with small vessel chronic ischemic changes of deep cerebral white matter. No acute intracranial abnormalities. Electronically Signed   By: Lavonia Dana M.D.   On: 05/22/2017 16:13    Procedures Procedures (including critical care time)  Medications Ordered in ED Medications  docusate sodium (COLACE) capsule 100 mg (100 mg Oral Given 05/23/17 0024)  gabapentin (NEURONTIN) capsule 100 mg (100 mg Oral Given 05/22/17 2241)  ipratropium-albuterol (DUONEB) 0.5-2.5 (3) MG/3ML nebulizer solution 3 mL (not administered)  polyvinyl alcohol (LIQUIFILM TEARS) 1.4 % ophthalmic solution 1 drop (1 drop Both Eyes Given 05/23/17 0041)  acetaminophen (TYLENOL) tablet 500 mg (not administered)  donepezil (ARICEPT) tablet 10 mg (10 mg Oral Given 05/23/17 0024)  mirtazapine (REMERON) tablet 15 mg (15 mg Oral Given 05/22/17 2241)  pantoprazole (PROTONIX) EC tablet 40 mg (40 mg Oral Given 05/23/17 0026)  traMADol (ULTRAM) tablet 50 mg (50 mg Oral Given 05/23/17 0025)  allopurinol (ZYLOPRIM) tablet 100 mg (100 mg Oral Given 05/23/17 0025)  clopidogrel (PLAVIX) tablet 75 mg (75 mg Oral Given  05/23/17 0026)  hydrALAZINE (APRESOLINE) tablet 25 mg (25 mg Oral Given 05/23/17 0026)  HYDROcodone-acetaminophen (NORCO/VICODIN) 5-325 MG per tablet 1 tablet (1 tablet Oral Given 05/23/17 0332)    labetalol (NORMODYNE) tablet 300 mg (300 mg Oral Given 05/23/17 0024)  latanoprost (XALATAN) 0.005 % ophthalmic solution 1 drop (1 drop Both Eyes Given 05/23/17 0040)  polyethylene glycol (MIRALAX / GLYCOLAX) packet 17 g (17 g Oral Given 05/23/17 0040)  enoxaparin (LOVENOX) injection 30 mg (not administered)  piperacillin-tazobactam (ZOSYN) IVPB 2.25 g (0 g Intravenous Stopped 05/23/17 0026)  MEDLINE mouth rinse (not administered)  sodium chloride 0.9 % bolus 1,000 mL (1,000 mLs Intravenous New Bag/Given 05/22/17 1457)     Initial Impression / Assessment and Plan / ED Course  I have reviewed the triage vital signs and the nursing notes.  Pertinent labs & imaging results that were available during my care of the patient were reviewed by me and considered in my medical decision making (see chart for details).     83 yo F With a chief complaints of fever and lethargy. The patient is feeling therapy at an advanced nursing unit.  She likely needs admission. Care turned over to Dr. Ralene Bathe, please see her notes for further details of care.   The patients results and plan were reviewed and discussed.   Any x-rays performed were independently reviewed by myself.   Differential diagnosis were considered with the presenting HPI.  Medications  docusate sodium (COLACE) capsule 100 mg (100 mg Oral Given 05/23/17 0024)  gabapentin (NEURONTIN) capsule 100 mg (100 mg Oral Given 05/22/17 2241)  ipratropium-albuterol (DUONEB) 0.5-2.5 (3) MG/3ML nebulizer solution 3 mL (not administered)  polyvinyl alcohol (LIQUIFILM TEARS) 1.4 % ophthalmic solution 1 drop (1 drop Both Eyes Given 05/23/17 0041)  acetaminophen (TYLENOL) tablet 500 mg (not administered)  donepezil (ARICEPT) tablet 10 mg (10 mg Oral Given 05/23/17 0024)  mirtazapine (REMERON) tablet 15 mg (15 mg Oral Given 05/22/17 2241)  pantoprazole (PROTONIX) EC tablet 40 mg (40 mg Oral Given 05/23/17 0026)  traMADol (ULTRAM) tablet 50 mg (50 mg Oral Given 05/23/17  0025)  allopurinol (ZYLOPRIM) tablet 100 mg (100 mg Oral Given 05/23/17 0025)  clopidogrel (PLAVIX) tablet 75 mg (75 mg Oral Given 05/23/17 0026)  hydrALAZINE (APRESOLINE) tablet 25 mg (25 mg Oral Given 05/23/17 0026)  HYDROcodone-acetaminophen (NORCO/VICODIN) 5-325 MG per tablet 1 tablet (1 tablet Oral Given 05/23/17 0332)  labetalol (NORMODYNE) tablet 300 mg (300 mg Oral Given 05/23/17 0024)  latanoprost (XALATAN) 0.005 % ophthalmic solution 1 drop (1 drop Both Eyes Given 05/23/17 0040)  polyethylene glycol (MIRALAX / GLYCOLAX) packet 17 g (17 g Oral Given 05/23/17 0040)  enoxaparin (LOVENOX) injection 30 mg (not administered)  piperacillin-tazobactam (ZOSYN) IVPB 2.25 g (0 g Intravenous Stopped 05/23/17 0026)  MEDLINE mouth rinse (not administered)  sodium chloride 0.9 % bolus 1,000 mL (1,000 mLs Intravenous New Bag/Given 05/22/17 1457)    Vitals:   05/22/17 1900 05/22/17 1930 05/22/17 2015 05/23/17 0635  BP: (!) 149/67 117/61 140/78 111/62  Pulse:  87 84 85  Resp: 12 12 16 16   Temp:   99.3 F (37.4 C) 98.4 F (36.9 C)  TempSrc:   Oral Oral  SpO2:  96% 98% 100%  Weight:   42.7 kg (94 lb 2.2 oz)   Height:   5\' 6"  (1.676 m)     Final diagnoses:  Somnolence       Final Clinical Impressions(s) / ED Diagnoses   Final diagnoses:  Somnolence  New Prescriptions Current Discharge Medication List       Deno Etienne, DO 05/23/17 4037

## 2017-05-22 NOTE — ED Provider Notes (Signed)
Patient visit shared. Patient here for decline in mental status recently, currently being treated for urinary tract infection. She is a resident of [facility]. She did have an unresponsive episode prior to ED arrival but in the emergency department she is alert and talkative.  Recent antibiotics include cefuroxime July 14 through July 19. She was on Levaquin 250 mg every other day starting July 20. Augmentin 500/125 July 27 Levaquin 500 mg on July 19 Rocephin 1 g IM July 13 she has a Foley catheter. Hemoglobin 9.7 on July 25. On July 23 BUN 63, creatinine 1.91, sodium 136, potassium 4.6, chloride 110. CBC on July 23 WBC 3.4, hemoglobin 10.2, platelets 197  Discussed with nurse practitioner that saw the event today. Overnight she had an episode of twitching and later in the day she had an episode when she was drinking juice and dropped the cup. She was unresponsive and her eyes rolled back in her head with sonorous respirations.  Concern for seizure versus syncope. Hospitalist consulted for admission for further treatment.   Quintella Reichert, MD 05/23/17 [phone]

## 2017-05-22 NOTE — Progress Notes (Signed)
Pharmacy Antibiotic Note  Elizabeth Daniels is a 83 y.o. female admitted on 05/22/2017 with aspiration PNA.  Pharmacy has been consulted for zosyn dosing. Wt 44.5, creat 1.8, creat cl 17 ml/min. WBC WNL. Lactate 2.13. AF.   Plan:   Zosyn 2.25 gm IV q8h  Temp (24hrs), Avg:98.9 F (37.2 C), Min:98.9 F (37.2 C), Max:98.9 F (37.2 C)   Recent Labs Lab 05/22/17 1440 05/22/17 1445 05/22/17 1446  WBC 5.0  --   --   CREATININE 1.96*  --  1.80*  LATICACIDVEN  --  2.13*  --     Estimated Creatinine Clearance: 16.6 mL/min (A) (by C-G formula based on SCr of 1.8 mg/dL (H)).    Allergies  Allergen Reactions  . Quinine Derivatives Rash    MAR  . Codeine     MAR    Thank you for allowing pharmacy to be a part of this patient's care.  Eudelia Bunch, Pharm.D. [phone] 05/22/2017 10:16 PM

## 2017-05-22 NOTE — ED Notes (Signed)
BLOOD DRAW ATTEMPT [id number]

## 2017-05-22 NOTE — ED Notes (Signed)
Bed: JI31 Expected date:  Expected time:  Means of arrival:  Comments: Hold for RES

## 2017-05-23 ENCOUNTER — Inpatient Hospital Stay (HOSPITAL_COMMUNITY): Payer: Medicare Other

## 2017-05-23 DIAGNOSIS — L8990 Pressure ulcer of unspecified site, unspecified stage: Secondary | ICD-10-CM | POA: Insufficient documentation

## 2017-05-23 DIAGNOSIS — I340 Nonrheumatic mitral (valve) insufficiency: Secondary | ICD-10-CM

## 2017-05-23 LAB — BASIC METABOLIC PANEL
Anion gap: 9 (ref 5–15)
BUN: 53 mg/dL — AB (ref 6–20)
CO2: 18 mmol/L — ABNORMAL LOW (ref 22–32)
Calcium: 9.1 mg/dL (ref 8.9–10.3)
Chloride: 111 mmol/L (ref 101–111)
Creatinine, Ser: 1.63 mg/dL — ABNORMAL HIGH (ref 0.44–1.00)
GFR, EST AFRICAN AMERICAN: 33 mL/min — AB (ref >60)
GFR, EST NON AFRICAN AMERICAN: 28 mL/min — AB (ref >60)
Glucose, Bld: 83 mg/dL (ref 65–99)
POTASSIUM: 4.4 mmol/L (ref 3.5–5.1)
Sodium: 138 mmol/L (ref 135–145)

## 2017-05-23 LAB — BRAIN NATRIURETIC PEPTIDE: B NATRIURETIC PEPTIDE 5: 835.6 pg/mL — AB (ref 0.0–100.0)

## 2017-05-23 LAB — TROPONIN I
TROPONIN I: 0.16 ng/mL — AB (ref <0.03)
Troponin I: 0.18 ng/mL (ref <0.03)
Troponin I: 0.18 ng/mL (ref <0.03)
Troponin I: 0.2 ng/mL (ref <0.03)
Troponin I: 0.21 ng/mL (ref <0.03)

## 2017-05-23 LAB — CBC
HCT: 31.2 % — ABNORMAL LOW (ref 36.0–46.0)
Hemoglobin: 10.4 g/dL — ABNORMAL LOW (ref 12.0–15.0)
MCH: 31.9 pg (ref 26.0–34.0)
MCHC: 33.3 g/dL (ref 30.0–36.0)
MCV: 95.7 fL (ref 78.0–100.0)
PLATELETS: 181 10*3/uL (ref 150–400)
RBC: 3.26 MIL/uL — AB (ref 3.87–5.11)
RDW: 18.2 % — ABNORMAL HIGH (ref 11.5–15.5)
WBC: 5.4 10*3/uL (ref 4.0–10.5)

## 2017-05-23 LAB — ECHOCARDIOGRAM COMPLETE
HEIGHTINCHES: 66 in
WEIGHTICAEL: 1506.18 [oz_av]

## 2017-05-23 MED ORDER — TORSEMIDE 10 MG PO TABS
10.0000 mg | ORAL_TABLET | Freq: Every day | ORAL | Status: DC
  Administered 2017-05-23 – 2017-05-25 (×3): 10 mg via ORAL
  Filled 2017-05-23 (×3): qty 1

## 2017-05-23 MED ORDER — ORAL CARE MOUTH RINSE
15.0000 mL | Freq: Two times a day (BID) | OROMUCOSAL | Status: DC
  Administered 2017-05-23 – 2017-05-24 (×3): 15 mL via OROMUCOSAL

## 2017-05-23 NOTE — Progress Notes (Signed)
CRITICAL VALUE ALERT  Critical Value:  Troponin 0.21  Date & Time Notied:  7/28 0754  Provider Notified: 7/28 0754  Orders Received/Actions taken: [id number]

## 2017-05-23 NOTE — Progress Notes (Signed)
MD update troponin and low BP. MD with order to hold hydralazine dose

## 2017-05-23 NOTE — Evaluation (Signed)
Clinical/Bedside Swallow Evaluation Patient Details  Name: Elizabeth Daniels MRN: [id number] Date of Birth: 1930-12-31  Today's Date: 05/23/2017 Time: SLP Start Time (ACUTE ONLY): 1255 SLP Stop Time (ACUTE ONLY): 1325 SLP Time Calculation (min) (ACUTE ONLY): 30 min  Past Medical History:  Past Medical History:  Diagnosis Date  . Anemia   . CHF (congestive heart failure) ([place]) 10/02/2015  . Dysphagia 10/02/2015  . Generalized anxiety disorder 10/02/2015  . GERD (gastroesophageal reflux disease) 10/02/2015  . Glaucoma 10/02/2015  . Gout 10/02/2015  . Hypertension   . Lung cancer ([place])   . PVD (peripheral vascular disease) ([place]) 10/02/2015   Past Surgical History:  Past Surgical History:  Procedure Laterality Date  . COLONOSCOPY WITH PROPOFOL N/A 10/03/2015   Procedure: COLONOSCOPY WITH PROPOFOL;  Surgeon: Ladene Artist, MD;  Location: [facility];  Service: Endoscopy;  Laterality: N/A;  . right leg amputation     07/2014   HPI:  Patient is is an 83 y.o. female resident of SNF with PMH: CKD3, CHF, PVD, s/p right AKA, moderate dementia and remote h/o lung CA, who presented to ED with syncope which MD suspects is related to an aspiration PNA.    Assessment / Plan / Recommendation Clinical Impression  Patient presents with a moderate oral dysphagia which appears largely impacted by cognitive impairment(h/o dementia). Patient was not able to feed self due to being distracted and confused, and would excessively chew ground meat and cooked carrots, then spit it out. No overt s/s of aspiration or penetration with thin liquids or solids.  SLP Visit Diagnosis: Dysphagia, oral phase (R13.11)    Aspiration Risk  Mild aspiration risk;Moderate aspiration risk    Diet Recommendation Dysphagia 2 (Fine chop);Thin liquid   Liquid Administration via: Cup;Straw Medication Administration: Whole meds with puree Supervision: Full supervision/cueing for compensatory strategies;Staff to assist with  self feeding Compensations: Minimize environmental distractions;Slow rate;Small sips/bites;Follow solids with liquid;Lingual sweep for clearance of pocketing Postural Changes: Seated upright at 90 degrees    Other  Recommendations Oral Care Recommendations: Oral care BID   Follow up Recommendations Skilled Nursing facility      Frequency and Duration min 1 x/week          Prognosis Prognosis for Safe Diet Advancement: Fair Barriers to Reach Goals: Cognitive deficits      Swallow Study   General Date of Onset: 05/22/17 HPI: Patient is is an 83 y.o. female resident of SNF with PMH: CKD3, CHF, PVD, s/p right AKA, moderate dementia and remote h/o lung CA, who presented to ED with syncope which MD suspects is related to an aspiration PNA.  Type of Study: Bedside Swallow Evaluation Previous Swallow Assessment: N/A Diet Prior to this Study: Dysphagia 3 (soft);Thin liquids Temperature Spikes Noted: No Respiratory Status: Room air History of Recent Intubation: No Behavior/Cognition: Alert;Cooperative;Pleasant mood;Confused;Distractible Oral Care Completed by SLP: No Oral Cavity - Dentition: Dentures, top Self-Feeding Abilities: Needs set up;Total assist Patient Positioning: Upright in bed Baseline Vocal Quality: Normal Volitional Cough: Cognitively unable to elicit Volitional Swallow: Unable to elicit    Oral/Motor/Sensory Function Overall Oral Motor/Sensory Function: Within functional limits   Ice Chips     Thin Liquid Thin Liquid: Within functional limits Presentation: Straw Other Comments: no overt s/s of aspiration    Nectar Thick     Honey Thick     Puree Puree: Within functional limits Presentation: Spoon   Solid   GO   Solid: Impaired Oral Phase Impairments: Impaired mastication Oral Phase Functional Implications:  Prolonged oral transit;Impaired mastication;Oral residue Other Comments: patient would chew excessively and eventually spit out cooked carrots and ground  meat.        Sonia Baller, MA, CCC-SLP 05/23/17 1:45 PM

## 2017-05-23 NOTE — Progress Notes (Addendum)
CRITICAL VALUE ALERT  Critical Value:  Troponin 0.18  Date & Time Notied:  05/23/2017 0049  Provider Notified: [place]    Orders Received/Actions taken: Continue to monitor

## 2017-05-23 NOTE — Progress Notes (Signed)
MD request f/u on echocardiogram completed? Paged echo at [place]. No call BACK. Paged again.

## 2017-05-23 NOTE — H&P (Signed)
[facility]                                                                              Patient Demographics  Elizabeth Daniels, is a 83 y.o. female, DOB - 1930-12-14, MCN:[id number]  Admit date - 05/22/2017   Admitting Physician Domenic Polite, MD  Outpatient Primary MD for the patient is Garwin Brothers, MD  Outpatient specialists:   LOS - 1  days    Chief Complaint  Patient presents with  . Urinary Tract Infection       Brief summary  83 y.o. female with multiple complicated medical history including but not limited to CKD3, CHF, PVD status post right AKA, moderate dementia, and remote history of lung cancer status post radiation therapy, presents with syncope.  She was recently treated with ceftriaxone followed by Levaquin for UTI at the nursing home. Initial hospital evaluation revealed creatinine jump up from 1.2 2016-1.9 with small right pleural effusion and associated possible right upper lobe pneumonic infiltrate. Head CT was negative  Assessment & Plan    Principal Problem:   Syncope Active Problems:   Malnutrition of moderate degree   HTN (hypertension)   Acute kidney injury superimposed on CKD (HCC)   PVD (peripheral vascular disease) (HCC)   CHF (congestive heart failure) (HCC)   Aspiration pneumonia (HCC)   Pressure injury of skin   #1 syncope: Likely related to suspected aspiration pneumonia/pneumonitis, ?UTI Mildly elevated troponin in the setting of chronic kidney disease- doubt ACS Elevated BNP 2-D echo - pending  #2 CHF: Elevated troponin- no clinic evidence of ACS Elevated BNP Gentle diuresis with monitoring of electrolytes Follow-up on echo results  #3 aspiration pneumonia/pneumonitis: Presumptive Antibiotics Supportive care  #4 acute on chronic kidney injury: Gentle hydration Monitor renal function and electrolytes   Code Status: DO NOT RESUSCITATE DVT Prophylaxis:  Lovenox  Family Communication:    Disposition  Plan: SNF  Time Spent in minutes  35 minutes  Procedures:    Consultants:     Antimicrobials:  Zosyn started 05/22/2017   Medications  Scheduled Meds: . allopurinol  100 mg Oral Daily  . clopidogrel  75 mg Oral Daily  . docusate sodium  100 mg Oral Daily  . donepezil  10 mg Oral QHS  . enoxaparin (LOVENOX) injection  30 mg Subcutaneous Q24H  . gabapentin  100 mg Oral BID  . hydrALAZINE  25 mg Oral TID  . labetalol  300 mg Oral TID  . latanoprost  1 drop Both Eyes QHS  . mouth rinse  15 mL Mouth Rinse BID  . mirtazapine  15 mg Oral QHS  . pantoprazole  40 mg Oral BID  . polyethylene glycol  17 g Oral Daily  . polyvinyl alcohol  1 drop Both Eyes BID  . traMADol  50 mg Oral QHS   Continuous Infusions: . piperacillin-tazobactam (ZOSYN)  IV 2.25 g (05/23/17 0915)   PRN Meds:.acetaminophen, HYDROcodone-acetaminophen, ipratropium-albuterol   Antibiotics   Anti-infectives    Start     Dose/Rate Route Frequency Ordered Stop   05/22/17 2215  piperacillin-tazobactam (ZOSYN) IVPB 2.25 g     2.25 g 100  mL/hr over 30 Minutes Intravenous Every 8 hours 05/22/17 2209          Subjective:   [name] was seen and examined today. Admitting H&P reviewed and noted. She denies any chest pain or shortness of breath. No fever or chills. Denies dizziness  Objective:   Vitals:   05/22/17 1930 05/22/17 2015 05/23/17 0635 05/23/17 0900  BP: 117/61 140/78 111/62   Pulse: 87 84 85 81  Resp: 12 16 16    Temp:  99.3 F (37.4 C) 98.4 F (36.9 C)   TempSrc:  Oral Oral   SpO2: 96% 98% 100%   Weight:  42.7 kg (94 lb 2.2 oz)    Height:  5\' 6"  (1.676 m)      Intake/Output Summary (Last 24 hours) at 05/23/17 4098 Last data filed at 05/23/17 0636  Gross per 24 hour  Intake              170 ml  Output              150 ml  Net               20 ml     Wt Readings from Last 3 Encounters:  05/22/17 42.7 kg (94 lb 2.2 oz)  05/18/17 44.5 kg (98 lb)  10/03/15 57.7 kg (127 lb  3.3 oz)     Exam  General: NAD  HEENT: NCAT,  PERRL,MMM  Neck: SUPPLE, (-) JVD  Cardiovascular: RRR, (-) GALLOP, (-) MURMUR  Respiratory: Decreased breath sounds at the bases, no adventitious sounds noted  Gastrointestinal: SOFT, (-) DISTENSION, BS(+), (_) TENDERNESS  Ext: (-) CYANOSIS, (-) EDEMA, right AKA  Neuro: Alert and responsive, moves all extremities,  Skin:(-) RASH  Psych:NORMAL AFFECT/MOOD   Data Reviewed:  I have personally reviewed following labs and imaging studies  Micro Results Recent Results (from the past 240 hour(s))  MRSA PCR Screening     Status: None   Collection Time: 05/22/17  9:56 PM  Result Value Ref Range Status   MRSA by PCR NEGATIVE NEGATIVE Final    Comment:        The GeneXpert MRSA Assay (FDA approved for NASAL specimens only), is one component of a comprehensive MRSA colonization surveillance program. It is not intended to diagnose MRSA infection nor to guide or monitor treatment for MRSA infections.     Radiology Reports Dg Chest 2 View  Result Date: 05/22/2017 CLINICAL DATA:  Altered mental status EXAM: CHEST  2 VIEW COMPARISON:  03/19/2009 FINDINGS: Rotated patient. Small bilateral pleural effusions on the lateral view. Bandlike opacity over the upper spine on lateral view, possibly right upper lobe. Borderline cardiomegaly. Tortuous aorta with enlarged mediastinal silhouette likely augmented by rotation. No pneumothorax. Advanced degenerative changes of the shoulders with bony remodeling of the distal clavicles and superior migration of both humeral heads. IMPRESSION: 1. Small bilateral pleural effusions. Bandlike opacity within the upper lung posteriorly on lateral view, could reflect infiltrate, not well localized on the frontal view possibly right upper lobe. Radiographic follow-up recommended 2. Tortuous ectatic aorta with mediastinal enlargement, likely augmented by rotation of the patient 3. Presumed chronic deformity of  both shoulders Electronically Signed   By: Donavan Foil M.D.   On: 05/22/2017 15:38   Ct Head Wo Contrast  Result Date: 05/22/2017 CLINICAL DATA:  Increased confusion versus baseline, history hypertension, lung cancer, CHF, recurrent UTIs EXAM: CT HEAD WITHOUT CONTRAST TECHNIQUE: Contiguous axial images were obtained from the base of the skull  through the vertex without intravenous contrast. Sagittal and coronal MPR images reconstructed from axial data set. COMPARISON:  None FINDINGS: Brain: Generalized atrophy. Normal ventricular morphology. No midline shift or mass effect. Small vessel chronic ischemic changes of deep cerebral white matter. No intracranial hemorrhage, mass lesion, evidence of acute infarction, or extra-axial fluid collection. Vascular: Significant atherosclerotic calcification of internal carotid and vertebral arteries at skullbase Skull: Intact.  Mild hyperostosis frontalis interna Sinuses/Orbits: Clear Other: N/A IMPRESSION: Atrophy with small vessel chronic ischemic changes of deep cerebral white matter. No acute intracranial abnormalities. Electronically Signed   By: Lavonia Dana M.D.   On: 05/22/2017 16:13    Lab Data:  CBC:  Recent Labs Lab 05/22/17 1440 05/22/17 1446 05/23/17 0505  WBC 5.0  --  5.4  NEUTROABS 3.9  --   --   HGB 11.8* 13.9 10.4*  HCT 36.1 41.0 31.2*  MCV 96.5  --  95.7  PLT 216  --  622   Basic Metabolic Panel:  Recent Labs Lab 05/22/17 1440 05/22/17 1446 05/23/17 0505  NA 140 140 138  K 5.4* 5.8* 4.4  CL 112* 109 111  CO2 21*  --  18*  GLUCOSE 92 88 83  BUN 58* 74* 53*  CREATININE 1.96* 1.80* 1.63*  CALCIUM 9.3  --  9.1   GFR: Estimated Creatinine Clearance: 17.6 mL/min (A) (by C-G formula based on SCr of 1.63 mg/dL (H)). Liver Function Tests:  Recent Labs Lab 05/22/17 1440  AST 20  ALT <5*  ALKPHOS 51  BILITOT 0.7  PROT 6.5  ALBUMIN 2.9*   No results for input(s): LIPASE, AMYLASE in the last 168 hours.  Recent  Labs Lab 05/22/17 1440  AMMONIA 24   Coagulation Profile: No results for input(s): INR, PROTIME in the last 168 hours. Cardiac Enzymes:  Recent Labs Lab 05/22/17 2357 05/23/17 0505  TROPONINI 0.18* 0.21*   BNP (last 3 results) No results for input(s): PROBNP in the last 8760 hours. HbA1C: No results for input(s): HGBA1C in the last 72 hours. CBG: No results for input(s): GLUCAP in the last 168 hours. Lipid Profile: No results for input(s): CHOL, HDL, LDLCALC, TRIG, CHOLHDL, LDLDIRECT in the last 72 hours. Thyroid Function Tests: No results for input(s): TSH, T4TOTAL, FREET4, T3FREE, THYROIDAB in the last 72 hours. Anemia Panel: No results for input(s): VITAMINB12, FOLATE, FERRITIN, TIBC, IRON, RETICCTPCT in the last 72 hours. Urine analysis:    Component Value Date/Time   COLORURINE YELLOW 05/22/2017 [place] 05/22/2017 1554   LABSPEC 1.013 05/22/2017 1554   PHURINE 5.0 05/22/2017 1554   GLUCOSEU NEGATIVE 05/22/2017 1554   HGBUR NEGATIVE 05/22/2017 1554   BILIRUBINUR NEGATIVE 05/22/2017 1554   KETONESUR NEGATIVE 05/22/2017 1554   PROTEINUR 30 (A) 05/22/2017 1554   UROBILINOGEN 0.2 03/26/2009 1258   NITRITE NEGATIVE 05/22/2017 [place] 05/22/2017 Sorrel M.D. [facility] 05/23/2017, 9:25 AM  Pager: [phone] Between 7am to 7pm - call Pager - [phone]  After 7pm go to [email or web address] - password [email or web address]  Call night coverage person covering after 7pm

## 2017-05-23 NOTE — Progress Notes (Signed)
  Echocardiogram 2D Echocardiogram has been performed.  Elizabeth Daniels 05/23/2017, 3:05 PM

## 2017-05-24 ENCOUNTER — Inpatient Hospital Stay (HOSPITAL_COMMUNITY): Payer: Medicare Other

## 2017-05-24 LAB — URINE CULTURE: CULTURE: NO GROWTH

## 2017-05-24 LAB — BASIC METABOLIC PANEL
ANION GAP: 13 (ref 5–15)
BUN: 47 mg/dL — ABNORMAL HIGH (ref 6–20)
CALCIUM: 9.4 mg/dL (ref 8.9–10.3)
CO2: 17 mmol/L — AB (ref 22–32)
Chloride: 109 mmol/L (ref 101–111)
Creatinine, Ser: 1.66 mg/dL — ABNORMAL HIGH (ref 0.44–1.00)
GFR, EST AFRICAN AMERICAN: 32 mL/min — AB (ref >60)
GFR, EST NON AFRICAN AMERICAN: 27 mL/min — AB (ref >60)
GLUCOSE: 82 mg/dL (ref 65–99)
POTASSIUM: 4.6 mmol/L (ref 3.5–5.1)
Sodium: 139 mmol/L (ref 135–145)

## 2017-05-24 LAB — BRAIN NATRIURETIC PEPTIDE: B Natriuretic Peptide: 722.8 pg/mL — ABNORMAL HIGH (ref 0.0–100.0)

## 2017-05-24 MED ORDER — PRO-STAT SUGAR FREE PO LIQD: 30.0000 mL | Freq: Two times a day (BID) | ORAL | Status: DC

## 2017-05-24 MED ORDER — SODIUM CHLORIDE 0.45 % IV SOLN
INTRAVENOUS | Status: DC
  Administered 2017-05-24: 14:00:00 via INTRAVENOUS
  Administered 2017-05-25: 75 mL/h via INTRAVENOUS

## 2017-05-24 MED ORDER — BOOST / RESOURCE BREEZE PO LIQD
1.0000 | Freq: Three times a day (TID) | ORAL | Status: DC
  Administered 2017-05-24 – 2017-05-25 (×3): 1 via ORAL

## 2017-05-24 MED ORDER — ENSURE ENLIVE PO LIQD: 237.0000 mL | Freq: Two times a day (BID) | ORAL | Status: DC

## 2017-05-24 NOTE — H&P (Signed)
[facility]                                                                              Patient Demographics  Elizabeth Daniels, is a 83 y.o. female, DOB - 02-11-1931, LKG:[id number]  Admit date - 05/22/2017   Admitting Physician Domenic Polite, MD  Outpatient Primary MD for the patient is Garwin Brothers, MD  Outpatient specialists:   LOS - 2  days    Chief Complaint  Patient presents with  . Urinary Tract Infection       Brief summary  83 y.o. female with multiple complicated medical history including but not limited to CKD3, CHF, PVD status post right AKA, moderate dementia, and remote history of lung cancer status post radiation therapy, presents with syncope.  She was recently treated with ceftriaxone followed by Levaquin for UTI at the nursing home. Initial hospital evaluation revealed creatinine jump up from 1.2 2016-1.9 with small right pleural effusion and associated possible right upper lobe pneumonic infiltrate. Head CT was negative  Assessment & Plan    Principal Problem:   Syncope Active Problems:   Malnutrition of moderate degree   HTN (hypertension)   Acute kidney injury superimposed on CKD (HCC)   PVD (peripheral vascular disease) (HCC)   CHF (congestive heart failure) (HCC)   Aspiration pneumonia (HCC)   Pressure injury of skin   #1 syncope: Likely related to suspected aspiration pneumonia/pneumonitis, ?UTI Mildly elevated troponin in the setting of chronic kidney disease- doubt ACS Elevated BNP 2-D echo - pending  #2 CHF: Elevated troponin- trending down -no clinic evidence of ACS Elevated BNP Gentle diuresis with monitoring of electrolytes 2D echo - EF 55-60%, mild LVH, Mod LAE  #3 aspiration pneumonia/pneumonitis: Presumptive Antibiotics Supportive care Speech recc- Dysphagia 2 ([place]);Thin liquid   #4 acute on chronic kidney injury: Gentle hydration Monitor renal function and electrolytes   Code Status: DO NOT  RESUSCITATE DVT Prophylaxis:  Lovenox  Family Communication:    Disposition Plan: SNF  Time Spent in minutes  35 minutes  Procedures:    Consultants:     Antimicrobials:  Zosyn started 05/22/2017   Medications  Scheduled Meds: . allopurinol  100 mg Oral Daily  . clopidogrel  75 mg Oral Daily  . docusate sodium  100 mg Oral Daily  . donepezil  10 mg Oral QHS  . enoxaparin (LOVENOX) injection  30 mg Subcutaneous Q24H  . gabapentin  100 mg Oral BID  . hydrALAZINE  25 mg Oral TID  . labetalol  300 mg Oral TID  . latanoprost  1 drop Both Eyes QHS  . mouth rinse  15 mL Mouth Rinse BID  . mirtazapine  15 mg Oral QHS  . pantoprazole  40 mg Oral BID  . polyethylene glycol  17 g Oral Daily  . polyvinyl alcohol  1 drop Both Eyes BID  . torsemide  10 mg Oral Daily  . traMADol  50 mg Oral QHS   Continuous Infusions: . piperacillin-tazobactam (ZOSYN)  IV 2.25 g (05/24/17 0926)   PRN Meds:.acetaminophen, HYDROcodone-acetaminophen, ipratropium-albuterol   Antibiotics   Anti-infectives    Start  Dose/Rate Route Frequency Ordered Stop   05/22/17 2215  piperacillin-tazobactam (ZOSYN) IVPB 2.25 g     2.25 g 100 mL/hr over 30 Minutes Intravenous Every 8 hours 05/22/17 2209          Subjective:   Mccartney Brucks was seen and examined today.No chest pain or shortness of breath. No fever or chills. Denies dizziness  Objective:   Vitals:   05/23/17 1556 05/23/17 2118 05/23/17 2224 05/24/17 0600  BP: (!) 146/60 (!) 144/74 (!) 168/69 (!) 148/70  Pulse: 73 80 75 82  Resp: 18 18 18 16   Temp: 98 F (36.7 C) 98 F (36.7 C)  98.2 F (36.8 C)  TempSrc: Oral Oral  Oral  SpO2: 100% 100% 100% 100%  Weight:      Height:        Intake/Output Summary (Last 24 hours) at 05/24/17 1025 Last data filed at 05/24/17 0630  Gross per 24 hour  Intake              170 ml  Output                9 ml  Net              161 ml     Wt Readings from Last 3 Encounters:  05/22/17  42.7 kg (94 lb 2.2 oz)  05/18/17 44.5 kg (98 lb)  10/03/15 57.7 kg (127 lb 3.3 oz)     Exam  General: NAD  HEENT: NCAT,  PERRL,MMM  Neck: SUPPLE, (-) JVD  Cardiovascular: RRR, (-) GALLOP, (-) MURMUR  Respiratory: Decreased breath sounds at the bases, no adventitious sounds noted  Gastrointestinal: SOFT, (-) DISTENSION, BS(+), (_) TENDERNESS  Ext: (-) CYANOSIS, (-) EDEMA, right AKA  Neuro: Alert and responsive, moves all extremities,  Skin:(-) RASH  Psych:NORMAL AFFECT/MOOD   Data Reviewed:  I have personally reviewed following labs and imaging studies  Micro Results Recent Results (from the past 240 hour(s))  Blood Culture (routine x 2)     Status: None (Preliminary result)   Collection Time: 05/22/17  2:40 PM  Result Value Ref Range Status   Specimen Description BLOOD LEFT ANTECUBITAL  Final   Special Requests   Final    BOTTLES DRAWN AEROBIC AND ANAEROBIC Blood Culture adequate volume   Culture   Final    NO GROWTH < 24 HOURS Performed at [facility], [address], [place], [place] [address]    Report Status PENDING  Incomplete  Blood Culture (routine x 2)     Status: None (Preliminary result)   Collection Time: 05/22/17  3:10 PM  Result Value Ref Range Status   Specimen Description BLOOD LEFT WRIST  Final   Special Requests IN PEDIATRIC BOTTLE Blood Culture adequate volume  Final   Culture   Final    NO GROWTH < 24 HOURS Performed at [facility], [place] [address] [place], [place] [address]    Report Status PENDING  Incomplete  Urine culture     Status: None   Collection Time: 05/22/17  3:54 PM  Result Value Ref Range Status   Specimen Description URINE, CATHETERIZED  Final   Special Requests NONE  Final   Culture   Final    NO GROWTH Performed at [facility], [address], [place], [place] [address]    Report Status 05/24/2017 FINAL  Final  MRSA PCR Screening     Status: None   Collection Time: 05/22/17  9:56 PM  Result  Value Ref Range Status   MRSA by PCR NEGATIVE NEGATIVE Final    Comment:        The GeneXpert MRSA Assay (FDA approved for NASAL specimens only), is one component of a comprehensive MRSA colonization surveillance program. It is not intended to diagnose MRSA infection nor to guide or monitor treatment for MRSA infections.     Radiology Reports Dg Chest 2 View  Result Date: 05/22/2017 CLINICAL DATA:  Altered mental status EXAM: CHEST  2 VIEW COMPARISON:  03/19/2009 FINDINGS: Rotated patient. Small bilateral pleural effusions on the lateral view. Bandlike opacity over the upper spine on lateral view, possibly right upper lobe. Borderline cardiomegaly. Tortuous aorta with enlarged mediastinal silhouette likely augmented by rotation. No pneumothorax. Advanced degenerative changes of the shoulders with bony remodeling of the distal clavicles and superior migration of both humeral heads. IMPRESSION: 1. Small bilateral pleural effusions. Bandlike opacity within the upper lung posteriorly on lateral view, could reflect infiltrate, not well localized on the frontal view possibly right upper lobe. Radiographic follow-up recommended 2. Tortuous ectatic aorta with mediastinal enlargement, likely augmented by rotation of the patient 3. Presumed chronic deformity of both shoulders Electronically Signed   By: Donavan Foil M.D.   On: 05/22/2017 15:38   Ct Head Wo Contrast  Result Date: 05/22/2017 CLINICAL DATA:  Increased confusion versus baseline, history hypertension, lung cancer, CHF, recurrent UTIs EXAM: CT HEAD WITHOUT CONTRAST TECHNIQUE: Contiguous axial images were obtained from the base of the skull through the vertex without intravenous contrast. Sagittal and coronal MPR images reconstructed from axial data set. COMPARISON:  None FINDINGS: Brain: Generalized atrophy. Normal ventricular morphology. No midline shift or mass effect. Small vessel chronic ischemic changes of deep cerebral white matter. No  intracranial hemorrhage, mass lesion, evidence of acute infarction, or extra-axial fluid collection. Vascular: Significant atherosclerotic calcification of internal carotid and vertebral arteries at skullbase Skull: Intact.  Mild hyperostosis frontalis interna Sinuses/Orbits: Clear Other: N/A IMPRESSION: Atrophy with small vessel chronic ischemic changes of deep cerebral white matter. No acute intracranial abnormalities. Electronically Signed   By: Lavonia Dana M.D.   On: 05/22/2017 16:13    Lab Data:  CBC:  Recent Labs Lab 05/22/17 1440 05/22/17 1446 05/23/17 0505  WBC 5.0  --  5.4  NEUTROABS 3.9  --   --   HGB 11.8* 13.9 10.4*  HCT 36.1 41.0 31.2*  MCV 96.5  --  95.7  PLT 216  --  893   Basic Metabolic Panel:  Recent Labs Lab 05/22/17 1440 05/22/17 1446 05/23/17 0505 05/24/17 0500  NA 140 140 138 139  K 5.4* 5.8* 4.4 4.6  CL 112* 109 111 109  CO2 21*  --  18* 17*  GLUCOSE 92 88 83 82  BUN 58* 74* 53* 47*  CREATININE 1.96* 1.80* 1.63* 1.66*  CALCIUM 9.3  --  9.1 9.4   GFR: Estimated Creatinine Clearance: 17.3 mL/min (A) (by C-G formula based on SCr of 1.66 mg/dL (H)). Liver Function Tests:  Recent Labs Lab 05/22/17 1440  AST 20  ALT <5*  ALKPHOS 51  BILITOT 0.7  PROT 6.5  ALBUMIN 2.9*   No results for input(s): LIPASE, AMYLASE in the last 168 hours.  Recent Labs Lab 05/22/17 1440  AMMONIA 24   Coagulation Profile: No results for input(s): INR, PROTIME in the last 168 hours. Cardiac Enzymes:  Recent Labs Lab 05/22/17 2357 05/23/17 0505 05/23/17 0935 05/23/17 1530 05/23/17 2132  TROPONINI 0.18* 0.21* 0.20*  0.18* 0.16*   BNP (last 3 results) No results for input(s): PROBNP in the last 8760 hours. HbA1C: No results for input(s): HGBA1C in the last 72 hours. CBG: No results for input(s): GLUCAP in the last 168 hours. Lipid Profile: No results for input(s): CHOL, HDL, LDLCALC, TRIG, CHOLHDL, LDLDIRECT in the last 72 hours. Thyroid Function  Tests: No results for input(s): TSH, T4TOTAL, FREET4, T3FREE, THYROIDAB in the last 72 hours. Anemia Panel: No results for input(s): VITAMINB12, FOLATE, FERRITIN, TIBC, IRON, RETICCTPCT in the last 72 hours. Urine analysis:    Component Value Date/Time   COLORURINE YELLOW 05/22/2017 [place] 05/22/2017 1554   LABSPEC 1.013 05/22/2017 1554   PHURINE 5.0 05/22/2017 1554   GLUCOSEU NEGATIVE 05/22/2017 1554   HGBUR NEGATIVE 05/22/2017 1554   BILIRUBINUR NEGATIVE 05/22/2017 1554   KETONESUR NEGATIVE 05/22/2017 1554   PROTEINUR 30 (A) 05/22/2017 1554   UROBILINOGEN 0.2 03/26/2009 1258   NITRITE NEGATIVE 05/22/2017 [place] 05/22/2017 [place] M.D. [facility] 05/24/2017, 10:25 AM  Pager: [phone] Between 7am to 7pm - call Pager - [phone]  After 7pm go to [email or web address] - password [email or web address]  Call night coverage person covering after 7pm

## 2017-05-24 NOTE — Progress Notes (Signed)
Initial Nutrition Assessment  DOCUMENTATION CODES:   Underweight, Non-severe (moderate) malnutrition in context of chronic illness  INTERVENTION:   -Provide Prostat liquid protein PO 30 ml BID with meals, each supplement provides 100 kcal, 15 grams protein. -Provide Ensure Enlive po BID, each supplement provides 350 kcal and 20 grams of protein  -RD will continue to monitor  NUTRITION DIAGNOSIS:   Malnutrition (Moderate) related to chronic illness, dysphagia (dementia) as evidenced by moderate depletion of body fat, moderate depletions of muscle mass.  GOAL:   Patient will meet greater than or equal to 90% of their needs  MONITOR:   PO intake, Supplement acceptance, Labs, Weight trends, I & O's, Skin  REASON FOR ASSESSMENT:   Malnutrition Screening Tool    ASSESSMENT:   83 y.o. female with multiple complicated medical history including but not limited to CKD3, CHF, PVD status post right AKA, moderate dementia, and remote history of lung cancer status post radiation therapy, presents with syncope.  Patient in room with no family at bedside. Pt with dementia, sacral pressure injury and rt AKA. Pt was evaluated by SLP, who recommends dysphagia 2 diet d/t moderate dysphagia. Pt unable to feed herself d/t confusion.  Per H&P, pt was taking Prostat supplements PTA. Will order and place order for additional supplements as well.   Per chart review, pt has lost weight since 7/23 but is insignificant in amount. Pt with moderate fat and muscle wasting.    Labs reviewed. Medications: Colace capsule daily, Remeron tablet daily, Protonix tablet BID, Miralax packet daily  Diet Order:  DIET DYS 2 Room service appropriate? Yes; Fluid consistency: Thin  Skin:  Wound (see comment) (Stage II pressure injury: sacrum)  Last BM:  PTA  Height:   Ht Readings from Last 1 Encounters:  05/22/17 5\' 6"  (1.676 m)    Weight:   Wt Readings from Last 1 Encounters:  05/22/17 94 lb 2.2 oz (42.7  kg)    Ideal Body Weight:  53.1 kg (adjusted for Rt AKA)  BMI:  Body mass index is 16.7 kg/m.  Estimated Nutritional Needs:   Kcal:  1300-1500  Protein:  55-65g  Fluid:  1.5L/day  EDUCATION NEEDS:   No education needs identified at this time  Clayton Bibles, MS, RD, LDN Pager: [phone] After Hours Pager: [phone]

## 2017-05-25 LAB — CBC
HCT: 33.6 % — ABNORMAL LOW (ref 36.0–46.0)
Hemoglobin: 11 g/dL — ABNORMAL LOW (ref 12.0–15.0)
MCH: 31.4 pg (ref 26.0–34.0)
MCHC: 32.7 g/dL (ref 30.0–36.0)
MCV: 96 fL (ref 78.0–100.0)
PLATELETS: 185 10*3/uL (ref 150–400)
RBC: 3.5 MIL/uL — AB (ref 3.87–5.11)
RDW: 18.2 % — ABNORMAL HIGH (ref 11.5–15.5)
WBC: 4.5 10*3/uL (ref 4.0–10.5)

## 2017-05-25 LAB — BASIC METABOLIC PANEL
ANION GAP: 12 (ref 5–15)
BUN: 42 mg/dL — AB (ref 6–20)
CO2: 17 mmol/L — ABNORMAL LOW (ref 22–32)
Calcium: 9.4 mg/dL (ref 8.9–10.3)
Chloride: 110 mmol/L (ref 101–111)
Creatinine, Ser: 1.5 mg/dL — ABNORMAL HIGH (ref 0.44–1.00)
GFR calc Af Amer: 36 mL/min — ABNORMAL LOW (ref >60)
GFR, EST NON AFRICAN AMERICAN: 31 mL/min — AB (ref >60)
Glucose, Bld: 84 mg/dL (ref 65–99)
POTASSIUM: 4.4 mmol/L (ref 3.5–5.1)
SODIUM: 139 mmol/L (ref 135–145)

## 2017-05-25 LAB — TROPONIN I: Troponin I: 0.16 ng/mL (ref <0.03)

## 2017-05-25 LAB — BRAIN NATRIURETIC PEPTIDE: B NATRIURETIC PEPTIDE 5: 1122 pg/mL — AB (ref 0.0–100.0)

## 2017-05-25 MED ORDER — AMOXICILLIN-POT CLAVULANATE 875-125 MG PO TABS: 1.0000 | ORAL_TABLET | Freq: Two times a day (BID) | ORAL | 0 refills | Status: AC

## 2017-05-25 MED ORDER — SODIUM BICARBONATE 650 MG PO TABS: 650.0000 mg | ORAL_TABLET | Freq: Two times a day (BID) | ORAL | 0 refills | Status: AC

## 2017-05-25 MED ORDER — SODIUM BICARBONATE 650 MG PO TABS
650.0000 mg | ORAL_TABLET | Freq: Two times a day (BID) | ORAL | Status: DC
  Administered 2017-05-25: 650 mg via ORAL
  Filled 2017-05-25: qty 1

## 2017-05-25 NOTE — Progress Notes (Signed)
Patient discharged to [facility] via ambulance, discharge packet prepared by CSW and given to ambulance for facility. Report called to facility. Patient in no pain/distress. Healing stage II clean dry and intact that she was admitted with, no sign of infection noted.

## 2017-05-25 NOTE — Care Management Note (Signed)
Case Management Note  Patient Details  Name: Elizabeth Daniels MRN: [id number] Date of Birth: [birth year]  Subjective/Objective:                    Action/Plan:d/c SNF   Expected Discharge Date:  05/25/17               Expected Discharge Plan:  Skilled Nursing Facility  In-House Referral:  Clinical Social Work  Discharge planning Services  CM Consult  Post Acute Care Choice:    Choice offered to:     DME Arranged:    DME Agency:     HH Arranged:    [place] Agency:     Status of Service:  Completed, signed off  If discussed at [employer], dates discussed:    Additional Comments:  Dessa Phi, RN 05/25/2017, 10:23 AM

## 2017-05-25 NOTE — Discharge Summary (Addendum)
Physician Discharge Summary  Elizabeth Daniels LFY:[id number] DOB: 1930-12-05 DOA: 05/22/2017  PCP: Garwin Brothers, MD  Admit date: 05/22/2017 Discharge date: 05/25/2017  Admitted From: SNF Disposition:  SNF  Recommendations for Outpatient Follow-up:  1. Follow up with PCP in 1-2 weeks 2. Please obtain BMP/CBC in one week 3. Please repeat B -met to follow bicarb and renal function.  4. Needs repeat chest xray to document resolution of PNA.    Discharge Condition: Stable.  CODE STATUS: DNR Diet recommendation: Dysphagia 2.   Brief/Interim Summary: 83 y.o.femalewith multiple complicated medical history including but not limited to CKD3, CHF, PVD status post right AKA, moderate dementia, and remote history of lung cancer status post radiation therapy, presents with syncope.  She was recently treated with ceftriaxone followed by Levaquin for UTI at the nursing home. Initial hospital evaluation revealed creatinine jump up from 1.2 2016-1.9 with small right pleural effusion and associated possible right upper lobe pneumonic infiltrate. Head CT was negative  Assessment & Plan    Principal Problem:   Syncope Active Problems:   Malnutrition of moderate degree   HTN (hypertension)   Acute kidney injury superimposed on CKD (HCC)   PVD (peripheral vascular disease) (HCC)   CHF (congestive heart failure) (HCC)   Aspiration pneumonia (HCC)   Pressure injury of skin   #1 syncope: Likely related to suspected aspiration pneumonia/pneumonitis.  Mildly elevated troponin in the setting of chronic kidney disease- doubt ACS Elevated BNP 2-D echo - normal EF  #2 Acute on chronic Diastolic  CHF: Elevated troponin- trending down -no clinic evidence of ACS Elevated BNP Gentle diuresis with monitoring of electrolytes 2D echo - EF 55-60%, mild LVH, Mod LAE Patient denies dyspnea, no hypoxemia.  Discharge on torsemide.   #3 aspiration  pneumonia/pneumonitis: Presumptive Antibiotics--received zosyn. She will be discharge on Augmentin.  Needs repeat chest x ray/ to document resolution.  Supportive care Speech recc- Dysphagia 2 (Fine chop);Thin liquid  #4 acute on chronic kidney injury: Monitor renal function and electrolytes cr decreased to 1.5.  Stop IV fluids.  Start oral bicarb for acidosis.    Discharge Diagnoses:  Principal Problem:   Syncope Active Problems:   Malnutrition of moderate degree   HTN (hypertension)   Acute kidney injury superimposed on CKD (HCC)   PVD (peripheral vascular disease) (HCC)   CHF (congestive heart failure) (HCC)   Aspiration pneumonia (HCC)   Pressure injury of skin    Discharge Instructions  Discharge Instructions    Diet - low sodium heart healthy    Complete by:  As directed    Increase activity slowly    Complete by:  As directed      Allergies as of 05/25/2017      Reactions   Quinine Derivatives Rash   MAR   Codeine    MAR      Medication List    STOP taking these medications   amoxicillin-clavulanate 500-125 MG tablet Commonly known as:  AUGMENTIN Replaced by:  amoxicillin-clavulanate 875-125 MG tablet   cefTRIAXone IVPB Commonly known as:  ROCEPHIN   epoetin alfa 10000 UNIT/ML injection Commonly known as:  EPOGEN,PROCRIT   levofloxacin 250 MG tablet Commonly known as:  LEVAQUIN     TAKE these medications   acetaminophen 500 MG tablet Commonly known as:  TYLENOL Take 500 mg by mouth every morning.   allopurinol 100 MG tablet Commonly known as:  ZYLOPRIM Take 100 mg by mouth daily.   amoxicillin-clavulanate 875-125 MG tablet Commonly known as:  AUGMENTIN Take 1 tablet by mouth 2 (two) times daily. Replaces:  amoxicillin-clavulanate 500-125 MG tablet   cholecalciferol 1000 units tablet Commonly known as:  VITAMIN D Take 1,000 Units by mouth daily.   clopidogrel 75 MG tablet Commonly known as:  PLAVIX Take 1 tablet (75 mg total)  by mouth daily.   docusate sodium 100 MG capsule Commonly known as:  COLACE Take 100 mg by mouth daily.   donepezil 10 MG tablet Commonly known as:  ARICEPT Take 10 mg by mouth at bedtime.   feeding supplement (PRO-STAT SUGAR FREE 64) Liqd Take 30 mLs by mouth 2 (two) times daily.   gabapentin 300 MG capsule Commonly known as:  NEURONTIN Take 300 mg by mouth daily.   gabapentin 400 MG capsule Commonly known as:  NEURONTIN Take 400 mg by mouth 2 (two) times daily.   hydrALAZINE 50 MG tablet Commonly known as:  APRESOLINE Take 50 mg by mouth 3 (three) times daily.   HYDROcodone-acetaminophen 5-325 MG tablet Commonly known as:  NORCO/VICODIN Take 1 tablet by mouth every 6 (six) hours as needed for moderate pain.   ipratropium-albuterol 0.5-2.5 (3) MG/3ML Soln Commonly known as:  DUONEB Take 3 mLs by nebulization every 4 (four) hours as needed (pneumonia).   labetalol 300 MG tablet Commonly known as:  NORMODYNE Take 300 mg by mouth 3 (three) times daily.   latanoprost 0.005 % ophthalmic solution Commonly known as:  XALATAN Place 1 drop into both eyes at bedtime.   mirtazapine 30 MG tablet Commonly known as:  REMERON Take 30 mg by mouth at bedtime.   ondansetron 4 MG tablet Commonly known as:  ZOFRAN Take 4 mg by mouth every 6 (six) hours as needed for nausea or vomiting.   pantoprazole 40 MG tablet Commonly known as:  PROTONIX Take 1 tablet (40 mg total) by mouth 2 (two) times daily.   polyethylene glycol packet Commonly known as:  MIRALAX / GLYCOLAX Take 17 g by mouth daily.   promethazine 12.5 MG tablet Commonly known as:  PHENERGAN Take 12.5 mg by mouth every 6 (six) hours as needed for nausea or vomiting.   sodium bicarbonate 650 MG tablet Take 1 tablet (650 mg total) by mouth 2 (two) times daily.   SYSTANE BALANCE OP Apply 1 drop to eye 2 (two) times daily.   torsemide 10 MG tablet Commonly known as:  DEMADEX Take 10 mg by mouth daily.    traMADol 50 MG tablet Commonly known as:  ULTRAM Take by mouth at bedtime.       Allergies  Allergen Reactions  . Quinine Derivatives Rash    MAR  . Codeine     MAR    Consultations:  none   Procedures/Studies: Dg Chest 1 View  Result Date: 05/24/2017 CLINICAL DATA:  Patient with history of aspiration pneumonia. Dementia. EXAM: CHEST 1 VIEW COMPARISON:  Chest radiograph 05/22/2017 FINDINGS: Patient is rotated to the left. Stable enlarged cardiac and mediastinal contours. Tortuosity and calcification of the thoracic aorta. Unchanged consolidative opacity right upper hemithorax. Probable small bilateral pleural effusions. IMPRESSION: Persistent consolidative opacity right upper hemithorax which may represent pneumonia in the appropriate clinical setting. Followup PA and lateral chest X-ray is recommended in 3-4 weeks following trial of antibiotic therapy to ensure resolution and exclude underlying malignancy. Small bilateral pleural effusions. Electronically Signed   By: Lovey Newcomer M.D.   On: 05/24/2017 11:03   Dg Chest 2 View  Result Date: 05/22/2017 CLINICAL DATA:  Altered mental status  EXAM: CHEST  2 VIEW COMPARISON:  03/19/2009 FINDINGS: Rotated patient. Small bilateral pleural effusions on the lateral view. Bandlike opacity over the upper spine on lateral view, possibly right upper lobe. Borderline cardiomegaly. Tortuous aorta with enlarged mediastinal silhouette likely augmented by rotation. No pneumothorax. Advanced degenerative changes of the shoulders with bony remodeling of the distal clavicles and superior migration of both humeral heads. IMPRESSION: 1. Small bilateral pleural effusions. Bandlike opacity within the upper lung posteriorly on lateral view, could reflect infiltrate, not well localized on the frontal view possibly right upper lobe. Radiographic follow-up recommended 2. Tortuous ectatic aorta with mediastinal enlargement, likely augmented by rotation of the patient  3. Presumed chronic deformity of both shoulders Electronically Signed   By: Donavan Foil M.D.   On: 05/22/2017 15:38   Ct Head Wo Contrast  Result Date: 05/22/2017 CLINICAL DATA:  Increased confusion versus baseline, history hypertension, lung cancer, CHF, recurrent UTIs EXAM: CT HEAD WITHOUT CONTRAST TECHNIQUE: Contiguous axial images were obtained from the base of the skull through the vertex without intravenous contrast. Sagittal and coronal MPR images reconstructed from axial data set. COMPARISON:  None FINDINGS: Brain: Generalized atrophy. Normal ventricular morphology. No midline shift or mass effect. Small vessel chronic ischemic changes of deep cerebral white matter. No intracranial hemorrhage, mass lesion, evidence of acute infarction, or extra-axial fluid collection. Vascular: Significant atherosclerotic calcification of internal carotid and vertebral arteries at skullbase Skull: Intact.  Mild hyperostosis frontalis interna Sinuses/Orbits: Clear Other: N/A IMPRESSION: Atrophy with small vessel chronic ischemic changes of deep cerebral white matter. No acute intracranial abnormalities. Electronically Signed   By: Lavonia Dana M.D.   On: 05/22/2017 16:13    (Echo, Carotid, EGD, Colonoscopy, ERCP)    Subjective:   Discharge Exam: Vitals:   05/25/17 0519 05/25/17 0927  BP: 128/82 135/77  Pulse: 84 91  Resp: 16   Temp: 98.1 F (36.7 C)    Vitals:   05/24/17 2140 05/25/17 0519 05/25/17 0633 05/25/17 0927  BP: 134/84 128/82  135/77  Pulse: 83 84  91  Resp:  16    Temp: 97.8 F (36.6 C) 98.1 F (36.7 C)    TempSrc: Oral Oral    SpO2: 100% 100%    Weight:   43.7 kg (96 lb 5.5 oz)   Height:        General: Pt is alert, awake, not in acute distress Cardiovascular: RRR, S1/S2 +, no rubs, no gallops Respiratory: CTA bilaterally, no wheezing, no rhonchi Abdominal: Soft, NT, ND, bowel sounds + Extremities: no edema, no cyanosis    The results of significant diagnostics from  this hospitalization (including imaging, microbiology, ancillary and laboratory) are listed below for reference.     Microbiology: Recent Results (from the past 240 hour(s))  Blood Culture (routine x 2)     Status: None (Preliminary result)   Collection Time: 05/22/17  2:40 PM  Result Value Ref Range Status   Specimen Description BLOOD LEFT ANTECUBITAL  Final   Special Requests   Final    BOTTLES DRAWN AEROBIC AND ANAEROBIC Blood Culture adequate volume   Culture   Final    NO GROWTH 2 DAYS Performed at [facility], [address] [place], [place] [address]    Report Status PENDING  Incomplete  Blood Culture (routine x 2)     Status: None (Preliminary result)   Collection Time: 05/22/17  3:10 PM  Result Value Ref Range Status   Specimen Description BLOOD LEFT WRIST  Final  Special Requests IN PEDIATRIC BOTTLE Blood Culture adequate volume  Final   Culture   Final    NO GROWTH 2 DAYS Performed at [facility], [place] [address] [place], [place] [address]    Report Status PENDING  Incomplete  Urine culture     Status: None   Collection Time: 05/22/17  3:54 PM  Result Value Ref Range Status   Specimen Description URINE, CATHETERIZED  Final   Special Requests NONE  Final   Culture   Final    NO GROWTH Performed at [facility], [address], [place], [place] [address]    Report Status 05/24/2017 FINAL  Final  MRSA PCR Screening     Status: None   Collection Time: 05/22/17  9:56 PM  Result Value Ref Range Status   MRSA by PCR NEGATIVE NEGATIVE Final    Comment:        The GeneXpert MRSA Assay (FDA approved for NASAL specimens only), is one component of a comprehensive MRSA colonization surveillance program. It is not intended to diagnose MRSA infection nor to guide or monitor treatment for MRSA infections.      Labs: BNP (last 3 results)  Recent Labs  05/23/17 0505 05/24/17 0500 05/25/17 0507  BNP 835.6* 722.8* 3,734.2*   Basic Metabolic  Panel:  Recent Labs Lab 05/22/17 1440 05/22/17 1446 05/23/17 0505 05/24/17 0500 05/25/17 0507  NA 140 140 138 139 139  K 5.4* 5.8* 4.4 4.6 4.4  CL 112* 109 111 109 110  CO2 21*  --  18* 17* 17*  GLUCOSE 92 88 83 82 84  BUN 58* 74* 53* 47* 42*  CREATININE 1.96* 1.80* 1.63* 1.66* 1.50*  CALCIUM 9.3  --  9.1 9.4 9.4   Liver Function Tests:  Recent Labs Lab 05/22/17 1440  AST 20  ALT <5*  ALKPHOS 51  BILITOT 0.7  PROT 6.5  ALBUMIN 2.9*   No results for input(s): LIPASE, AMYLASE in the last 168 hours.  Recent Labs Lab 05/22/17 1440  AMMONIA 24   CBC:  Recent Labs Lab 05/22/17 1440 05/22/17 1446 05/23/17 0505 05/25/17 0507  WBC 5.0  --  5.4 4.5  NEUTROABS 3.9  --   --   --   HGB 11.8* 13.9 10.4* 11.0*  HCT 36.1 41.0 31.2* 33.6*  MCV 96.5  --  95.7 96.0  PLT 216  --  181 185   Cardiac Enzymes:  Recent Labs Lab 05/23/17 0505 05/23/17 0935 05/23/17 1530 05/23/17 2132 05/25/17 0507  TROPONINI 0.21* 0.20* 0.18* 0.16* 0.16*   BNP: Invalid input(s): POCBNP CBG: No results for input(s): GLUCAP in the last 168 hours. D-Dimer No results for input(s): DDIMER in the last 72 hours. Hgb A1c No results for input(s): HGBA1C in the last 72 hours. Lipid Profile No results for input(s): CHOL, HDL, LDLCALC, TRIG, CHOLHDL, LDLDIRECT in the last 72 hours. Thyroid function studies No results for input(s): TSH, T4TOTAL, T3FREE, THYROIDAB in the last 72 hours.  Invalid input(s): FREET3 Anemia work up No results for input(s): VITAMINB12, FOLATE, FERRITIN, TIBC, IRON, RETICCTPCT in the last 72 hours. Urinalysis    Component Value Date/Time   COLORURINE YELLOW 05/22/2017 1554   APPEARANCEUR CLEAR 05/22/2017 1554   LABSPEC 1.013 05/22/2017 1554   PHURINE 5.0 05/22/2017 1554   GLUCOSEU NEGATIVE 05/22/2017 1554   HGBUR NEGATIVE 05/22/2017 1554   BILIRUBINUR NEGATIVE 05/22/2017 1554   [place] 05/22/2017 1554   PROTEINUR 30 (A) 05/22/2017 1554  UROBILINOGEN 0.2 03/26/2009 1258   NITRITE NEGATIVE 05/22/2017 1554   LEUKOCYTESUR NEGATIVE 05/22/2017 1554   Sepsis Labs Invalid input(s): PROCALCITONIN,  WBC,  LACTICIDVEN Microbiology Recent Results (from the past 240 hour(s))  Blood Culture (routine x 2)     Status: None (Preliminary result)   Collection Time: 05/22/17  2:40 PM  Result Value Ref Range Status   Specimen Description BLOOD LEFT ANTECUBITAL  Final   Special Requests   Final    BOTTLES DRAWN AEROBIC AND ANAEROBIC Blood Culture adequate volume   Culture   Final    NO GROWTH 2 DAYS Performed at [facility], [place] [address] [place], [place] [address]    Report Status PENDING  Incomplete  Blood Culture (routine x 2)     Status: None (Preliminary result)   Collection Time: 05/22/17  3:10 PM  Result Value Ref Range Status   Specimen Description BLOOD LEFT WRIST  Final   Special Requests IN PEDIATRIC BOTTLE Blood Culture adequate volume  Final   Culture   Final    NO GROWTH 2 DAYS Performed at [facility], [place] [address], [place], [place] [address]    Report Status PENDING  Incomplete  Urine culture     Status: None   Collection Time: 05/22/17  3:54 PM  Result Value Ref Range Status   Specimen Description URINE, CATHETERIZED  Final   Special Requests NONE  Final   Culture   Final    NO GROWTH Performed at [facility], [address], [place], [place] [address]    Report Status 05/24/2017 FINAL  Final  MRSA PCR Screening     Status: None   Collection Time: 05/22/17  9:56 PM  Result Value Ref Range Status   MRSA by PCR NEGATIVE NEGATIVE Final    Comment:        The GeneXpert MRSA Assay (FDA approved for NASAL specimens only), is one component of a comprehensive MRSA colonization surveillance program. It is not intended to diagnose MRSA infection nor to guide or monitor treatment for MRSA infections.      Time coordinating discharge: Over 30 minutes  SIGNED:   Elmarie Shiley, MD  [facility] 05/25/2017, 10:22 AM Pager   If 7PM-7AM, please contact night-coverage [email or web address] Password [email or web address]

## 2017-05-25 NOTE — NC FL2 (Signed)
[place] LEVEL OF CARE SCREENING TOOL     IDENTIFICATION  Patient Name: FAJR FIFE Birthdate: 12-26-1930 Sex: female Admission Date (Current Location): 05/22/2017  [facility] and [place] Number:  Herbalist and Address:  [facility],  [place] [address], [place]      Provider Number: [id number]  Attending Physician Name and Address:  Elmarie Shiley, MD  Relative Name and Phone Number:       Current Level of Care: Hospital Recommended Level of Care: [place] Prior Approval Number:    Date Approved/Denied:   PASRR Number: [id number]  Discharge Plan: SNF    Current Diagnoses: Patient Active Problem List   Diagnosis Date Noted  . Pressure injury of skin 05/23/2017  . Aspiration pneumonia ([place]) 05/22/2017  . Gastrointestinal hemorrhage with melena   . Malnutrition of moderate degree 10/02/2015  . HTN (hypertension) 10/02/2015  . Syncope 10/02/2015  . Acute blood loss anemia 10/02/2015  . Acute kidney injury superimposed on CKD ([place]) 10/02/2015  . GERD (gastroesophageal reflux disease) 10/02/2015  . Glaucoma 10/02/2015  . Gout 10/02/2015  . PVD (peripheral vascular disease) ([place]) 10/02/2015  . Dysphagia 10/02/2015  . Generalized anxiety disorder 10/02/2015  . CHF (congestive heart failure) ([place]) 10/02/2015  . Hematochezia   . Abnormal CT scan, colon   . GI bleed 09/30/2015    Orientation RESPIRATION BLADDER Height & Weight     Self  Normal Incontinent Weight: 96 lb 5.5 oz (43.7 kg) Height:  5\' 6"  (167.6 cm)  BEHAVIORAL SYMPTOMS/MOOD NEUROLOGICAL BOWEL NUTRITION STATUS        Diet (DYS 2)  AMBULATORY STATUS COMMUNICATION OF NEEDS Skin   Total Care Verbally Other (Comment) (PressureInjury07/27/18StageII-Partialthicknesslossofdermispresentingasashallowopenulcerwithared,pinkwoundbedwithoutslough.  Location: Sacrum Location Orientation: Medial Foam dressing PRN)                       Personal Care Assistance Level of Assistance  Total care           Functional Limitations Info             SPECIAL CARE FACTORS FREQUENCY                       Contractures Contractures Info: Not present    Additional Factors Info  Code Status, Allergies Code Status Info: DNR Allergies Info: Quinine Derivatives;Codeine           Current Medications (05/25/2017):  This is the current hospital active medication list Current Facility-Administered Medications  Medication Dose Route Frequency Provider Last Rate Last Dose  . acetaminophen (TYLENOL) tablet 500 mg  500 mg Oral Q6H PRN Domenic Polite, MD      . allopurinol (ZYLOPRIM) tablet 100 mg  100 mg Oral Daily Domenic Polite, MD   100 mg at 05/25/17 0929  . clopidogrel (PLAVIX) tablet 75 mg  75 mg Oral Daily Domenic Polite, MD   75 mg at 05/25/17 6301  . docusate sodium (COLACE) capsule 100 mg  100 mg Oral Daily Domenic Polite, MD   100 mg at 05/25/17 0929  . donepezil (ARICEPT) tablet 10 mg  10 mg Oral QHS Domenic Polite, MD   10 mg at 05/24/17 2131  . enoxaparin (LOVENOX) injection 30 mg  30 mg Subcutaneous Q24H Domenic Polite, MD   30 mg at 05/25/17 6010  . feeding supplement (BOOST / RESOURCE BREEZE) liquid 1 Container  1 Container Oral TID BM Benito Mccreedy, MD  1 Container at 05/25/17 0927  . gabapentin (NEURONTIN) capsule 100 mg  100 mg Oral BID Domenic Polite, MD   100 mg at 05/25/17 3491  . hydrALAZINE (APRESOLINE) tablet 25 mg  25 mg Oral TID Domenic Polite, MD   25 mg at 05/25/17 7915  . HYDROcodone-acetaminophen (NORCO/VICODIN) 5-325 MG per tablet 1 tablet  1 tablet Oral Q6H PRN Domenic Polite, MD   1 tablet at 05/24/17 2030  . ipratropium-albuterol (DUONEB) 0.5-2.5 (3) MG/3ML nebulizer solution 3 mL  3 mL Nebulization Q4H PRN Domenic Polite, MD      . labetalol (NORMODYNE) tablet 300 mg  300 mg Oral TID Domenic Polite, MD   300 mg at 05/25/17 0569  . latanoprost (XALATAN)  0.005 % ophthalmic solution 1 drop  1 drop Both Eyes QHS Domenic Polite, MD   1 drop at 05/24/17 2130  . MEDLINE mouth rinse  15 mL Mouth Rinse BID Domenic Polite, MD   15 mL at 05/24/17 0929  . mirtazapine (REMERON) tablet 15 mg  15 mg Oral QHS Domenic Polite, MD   15 mg at 05/24/17 2131  . pantoprazole (PROTONIX) EC tablet 40 mg  40 mg Oral BID Domenic Polite, MD   40 mg at 05/25/17 7948  . piperacillin-tazobactam (ZOSYN) IVPB 2.25 g  2.25 g Intravenous Marlou Starks, MD 100 mL/hr at 05/25/17 0358 2.25 g at 05/25/17 0358  . polyethylene glycol (MIRALAX / GLYCOLAX) packet 17 g  17 g Oral Daily Domenic Polite, MD   17 g at 05/25/17 0930  . polyvinyl alcohol (LIQUIFILM TEARS) 1.4 % ophthalmic solution 1 drop  1 drop Both Eyes BID Domenic Polite, MD   1 drop at 05/25/17 0930  . sodium bicarbonate tablet 650 mg  650 mg Oral BID Regalado, Belkys A, MD   650 mg at 05/25/17 0929  . torsemide (DEMADEX) tablet 10 mg  10 mg Oral Daily Osei-Bonsu, Iona Beard, MD   10 mg at 05/25/17 0165  . traMADol (ULTRAM) tablet 50 mg  50 mg Oral QHS Domenic Polite, MD   50 mg at 05/24/17 2131     Discharge Medications: Please see discharge summary for a list of discharge medications.  Relevant Imaging Results:  Relevant Lab Results:   Additional Information SS # [id number]  Burnis Medin, LCSW

## 2017-05-25 NOTE — Progress Notes (Signed)
PT Cancellation Note  Patient Details Name: Elizabeth Daniels MRN: [id number] DOB: [birth year]   Cancelled Treatment:    Reason Eval/Treat Not Completed: PT screened, no needs identified, will sign off. Pt is from SNF-long term. Pt set to d/c back to SNF. Will defer PT eval, if warranted, to SNF. Will sign off.    Weston Anna, MPT Pager: [phone]

## 2017-05-25 NOTE — Clinical Social Work Note (Signed)
Clinical Social Work Assessment  Patient Details  Name: Elizabeth Daniels MRN: [id number] Date of Birth: 1931/05/15  Date of referral:  05/25/17               Reason for consult:  Facility Placement                Permission sought to share information with:  Facility Art therapist granted to share information::  Yes, Verbal Permission Granted  Name::        Agency::     Relationship::     Contact Information:     Housing/Transportation Living arrangements for the past 2 months:  [place] of Information:  Adult Children (Son Dejanique Ruehl [phone]) Patient Interpreter Needed:  None Criminal Activity/Legal Involvement Pertinent to Current Situation/Hospitalization:  No - Comment as needed Significant Relationships:  Adult Children Lives with:  Facility Resident Do you feel safe going back to the place where you live?  Yes Need for family participation in patient care:  Yes (Comment)  Care giving concerns:  Patient Raffaela Ladley term care resident at [facility], plan for patient to return.   Social Worker assessment / plan:  CSW spoke with patient's son regarding discharge planning. Patient's son reported that patient has been a LTC resident at [facility] since August 2016 and the plan is for patient to return. Patient's son reported that patient likes the SNF pretty good. Patient's son reported that patient will need PTAR for transportation.  Employment status:  Retired Nurse, adult, Medicaid In [place] PT Recommendations:  Not assessed at this time Information / Referral to community resources:  [place]  Patient/Family's Response to care:  Patient's son appreciative of call from [place]. Patient's son agreeable to plan to dc patient back to current SNF.  Patient/Family's Understanding of and Emotional Response to Diagnosis, Current Treatment, and Prognosis:  Patient's son presented pleasant  throughout assessment. Patient's son verbalized understanding of patient diagnosis and care. Patient's son reported that they are running tests on patient to figure out prognosis for patient. Patient's son appears to be involved in patient's care, noting that he is in route to hospital to get an update on patient's care.   Emotional Assessment Appearance:    Attitude/Demeanor/Rapport:  Unable to Assess Affect (typically observed):  Unable to Assess Orientation:  Oriented to Place Alcohol / Substance use:  Not Applicable Psych involvement (Current and /or in the community):  No (Comment)  Discharge Needs  Concerns to be addressed:  No discharge needs identified Readmission within the last 30 days:  No Current discharge risk:  None Barriers to Discharge:  No Barriers Identified   Burnis Medin, LCSW 05/25/2017, 9:45 AM

## 2017-05-25 NOTE — Progress Notes (Addendum)
CSW spoke with staff from [facility] and confirmed patient's ability to return. Staff requested that patient arrive around 3pm. CSW will coordinate transportation with [facility] and notify family.   [facility] contacted, transportation arranged for 2:30pm. Patient's family notified. Patient's RN can call report to [phone]. CSW signing off, no other needs identified. Please consult if new needs arise.   Abundio Miu, [place] Social Worker [facility] Cell#: [phone]

## 2017-05-27 LAB — CULTURE, BLOOD (ROUTINE X 2)
CULTURE: NO GROWTH
Culture: NO GROWTH
SPECIAL REQUESTS: ADEQUATE
Special Requests: ADEQUATE

## 2017-09-23 ENCOUNTER — Encounter: Payer: Self-pay | Admitting: Physician Assistant

## 2017-09-23 ENCOUNTER — Ambulatory Visit (INDEPENDENT_AMBULATORY_CARE_PROVIDER_SITE_OTHER): Payer: Medicare Other | Admitting: Physician Assistant

## 2017-09-23 VITALS — BP 156/70 | HR 88

## 2017-09-23 DIAGNOSIS — R112 Nausea with vomiting, unspecified: Secondary | ICD-10-CM

## 2017-09-23 DIAGNOSIS — K5900 Constipation, unspecified: Secondary | ICD-10-CM | POA: Diagnosis not present

## 2017-09-23 DIAGNOSIS — K602 Anal fissure, unspecified: Secondary | ICD-10-CM | POA: Diagnosis not present

## 2017-09-23 DIAGNOSIS — K6289 Other specified diseases of anus and rectum: Secondary | ICD-10-CM | POA: Diagnosis not present

## 2017-09-23 MED ORDER — AMBULATORY NON FORMULARY MEDICATION: 0.1250 mg | Freq: Three times a day (TID) | 1 refills | Status: DC

## 2017-09-23 NOTE — Progress Notes (Signed)
Chief Complaint: Abdominal Pain  HPI:     Elizabeth Daniels is an 83 year old African-American female with a past medical history of dysphagia, GERD, generalized anxiety disorder, hypertension and lung cancer as well as others as below, who presents clinic today from a nursing home, with a complaint of abdominal pain, nausea and vomiting.    Please recall patient did follow distantly in our clinic with Dr. Ardis Hughs while in the hospital but had a colonoscopy with Dr. Fuller Plan 10/03/15.  There are findings of moderate diverticulosis in the sigmoid and transverse colon as well as ascending colon.    At time of last visit, patient presented to clinic accompanied by her daughter. They described that she lived in assisted living facility but her daughter was with her quite frequently. She described that "my mom is always had a sensitive stomach" and described episodes of nausea and vomiting over the past few years which had worsened and become more consistent over the past couple of months. This would happen 1-2 times a week not necessarily around eating. They also described a stomach pain in the "mid stomach" that was a 9/10 at its worst and somewhat relieved with vomiting and then "sitting down for a while".  It was recommended that we try conservative measures first, We stopped patient's Dexilant 30 mg daily and changed patient to Pantoprazole 40 mg twice a day. Also encouraged patient to a nondairy supplement to be taken 3 times daily with meals. It was discussed that if patient did not improve on her medications then we could proceed with a CT abdomen and pelvis for further investigation.    Today, the patient presents to clinic accompanied by her daughter. She tells me that her nausea and vomiting has no longer been occurring and she is not having any abdominal pain. She has continued on Pantoprazole 40 mg twice a day. She also had Mylanta recently added by her nursing facility.    Patient's main complaint today  is of a rectal pain. Patient tells me that this is sharp in nature and some days it is worse than others. Her daughter tells me that sometimes she will go to visit her mother and hear her "screaming in pain", related to this rectal pain. Patient tells me this is sharp and sometimes comes out of nowhere. They have been apparently applying a "cream" at the nursing facility, but neither of them are sure what this was. This has "been helping a little bit". Patient tells me that recently she has been more constipated than normal and they have also added Senokot to her daily regimen to maintain regular bowel movements. With the addition of this medication she is having mostly daily soft bowel movements, prior to that she was straining for hard stool.    Patient denies fever, chills, blood in her stool, melena, weight loss, anorexia or symptoms that awaken her at night.   Past Medical History:  Diagnosis Date  . Anemia   . CHF (congestive heart failure) ([place]) 10/02/2015  . Dysphagia 10/02/2015  . Generalized anxiety disorder 10/02/2015  . GERD (gastroesophageal reflux disease) 10/02/2015  . Glaucoma 10/02/2015  . Gout 10/02/2015  . Hypertension   . Lung cancer ([place])   . PVD (peripheral vascular disease) ([place]) 10/02/2015    Past Surgical History:  Procedure Laterality Date  . COLONOSCOPY WITH PROPOFOL N/A 10/03/2015   Procedure: COLONOSCOPY WITH PROPOFOL;  Surgeon: Ladene Artist, MD;  Location: [facility];  Service: Endoscopy;  Laterality: N/A;  .  right leg amputation     07/2014    Current Outpatient Medications  Medication Sig Dispense Refill  . acetaminophen (TYLENOL) 500 MG tablet Take 500 mg by mouth every morning.    Marland Kitchen allopurinol (ZYLOPRIM) 100 MG tablet Take 100 mg by mouth daily.    . Amino Acids-Protein Hydrolys (FEEDING SUPPLEMENT, PRO-STAT SUGAR FREE 64,) LIQD Take 30 mLs by mouth 2 (two) times daily.    . cholecalciferol (VITAMIN D) 1000 UNITS tablet Take 1,000 Units by mouth daily.      . clopidogrel (PLAVIX) 75 MG tablet Take 1 tablet (75 mg total) by mouth daily.    Marland Kitchen docusate sodium (COLACE) 100 MG capsule Take 100 mg by mouth daily.    Marland Kitchen donepezil (ARICEPT) 10 MG tablet Take 10 mg by mouth at bedtime.    . gabapentin (NEURONTIN) 300 MG capsule Take 300 mg by mouth daily.     Marland Kitchen gabapentin (NEURONTIN) 400 MG capsule Take 400 mg by mouth 2 (two) times daily.    . hydrALAZINE (APRESOLINE) 50 MG tablet Take 50 mg by mouth 3 (three) times daily.    Marland Kitchen HYDROcodone-acetaminophen (NORCO/VICODIN) 5-325 MG tablet Take 1 tablet by mouth every 6 (six) hours as needed for moderate pain.    Marland Kitchen ipratropium-albuterol (DUONEB) 0.5-2.5 (3) MG/3ML SOLN Take 3 mLs by nebulization every 4 (four) hours as needed (pneumonia).    . labetalol (NORMODYNE) 300 MG tablet Take 300 mg by mouth 3 (three) times daily.    Marland Kitchen latanoprost (XALATAN) 0.005 % ophthalmic solution Place 1 drop into both eyes at bedtime.     . mirtazapine (REMERON) 30 MG tablet Take 30 mg by mouth at bedtime.    . ondansetron (ZOFRAN) 4 MG tablet Take 4 mg by mouth every 6 (six) hours as needed for nausea or vomiting.    . pantoprazole (PROTONIX) 40 MG tablet Take 1 tablet (40 mg total) by mouth 2 (two) times daily. 90 tablet 3  . polyethylene glycol (MIRALAX / GLYCOLAX) packet Take 17 g by mouth daily.    . promethazine (PHENERGAN) 12.5 MG tablet Take 12.5 mg by mouth every 6 (six) hours as needed for nausea or vomiting.    Marland Kitchen Propylene Glycol (SYSTANE BALANCE OP) Apply 1 drop to eye 2 (two) times daily.    . sodium bicarbonate 650 MG tablet Take 1 tablet (650 mg total) by mouth 2 (two) times daily. 60 tablet 0  . torsemide (DEMADEX) 10 MG tablet Take 10 mg by mouth daily.    . traMADol (ULTRAM) 50 MG tablet Take by mouth at bedtime.     No current facility-administered medications for this visit.     Allergies as of 09/23/2017 - Review Complete 05/22/2017  Allergen Reaction Noted  . Quinine derivatives Rash 09/30/2015  .  Codeine  09/30/2015    Family History  Problem Relation Age of Onset  . Lung cancer Brother     Social History   Socioeconomic History  . Marital status: Single    Spouse name: Not on file  . Number of children: 2  . Years of education: Not on file  . Highest education level: Not on file  Social Needs  . Financial resource strain: Not on file  . Food insecurity - worry: Not on file  . Food insecurity - inability: Not on file  . Transportation needs - medical: Not on file  . Transportation needs - non-medical: Not on file  Occupational History  . Occupation: retired  Tobacco  Use  . Smoking status: Former Smoker  . Smokeless tobacco: Never Used  Substance and Sexual Activity  . Alcohol use: No  . Drug use: No  . Sexual activity: Not Currently  Other Topics Concern  . Not on file  Social History Narrative  . Not on file    Review of Systems:    Constitutional: No weight loss, fever or chills Cardiovascular: No chest pain  Respiratory: No SOB  Gastrointestinal: See HPI and otherwise negative   Physical Exam:  Vital signs: BP (!) 156/70 (BP Location: Left Arm, Patient Position: Sitting, Cuff Size: Normal)   Pulse 88    Constitutional:   Pleasant Elderly AA female appears to be in NAD, Well developed, Well nourished, alert and cooperative Respiratory: Respirations even and unlabored. Lungs clear to auscultation bilaterally.   No wheezes, crackles, or rhonchi.  Cardiovascular: Normal S1, S2. No MRG. Regular rate and rhythm. No peripheral edema, cyanosis or pallor.  Gastrointestinal:  Soft, nondistended, nontender. No rebound or guarding. Normal bowel sounds. No appreciable masses or hepatomegaly. Rectal: External exam: Posterior anal fissure, ttp; Internal exam: Not done due to discomfort Msk:  R BKA Psychiatric:  Demonstrates good judgement and reason without abnormal affect or behaviors.  NO recent labs or imaging.  Assessment: 1. Epigastric pain: Resolved with  addition of pantoprazole 40 mg twice a day 2. Nausea vomiting: Resolved as above 3. Anal fissure: Seen at time exam today and likely the cause of recent rectal pain 4. Constipation: Better with Senokot and stool softener  Plan: 1. Continue laxative regimen 2. Discussed anal fissure with the patient and her daughter today. 3. Prescribe Nitroglycerin glycerin ointment 0.125% to be applied 3 times a day 6-8 weeks with one refill 4. Recommend sitz baths 3 times a day for 15-20 minutes at a time. Patient can also do sitz baths if pain is extreme at times. 5. Recommend over-the-counter recta -care cream with lidocaine to be applied when necessary for pain 6. Continue Pantoprazole 40 mg twice a day 7. Patient to follow in clinic in 6-8 weeks with me   Ellouise Newer, PA-C [facility] 09/23/2017, 1:59 PM  Cc: Garwin Brothers, MD

## 2017-09-23 NOTE — Patient Instructions (Signed)
We have sent a prescription for nitroglycerin 0.125% gel to [facility]. You should apply a pea size amount to your rectum three times daily x 6-8 weeks up to the first knuckle.   [facility]'s information is below: Address: [address] [place], [place] [address]  [phone]  Please purchase the following medications over the counter and take as directed: Reticare with lidocaine as needed for pain.   Sitz baths 3-4 times day.   Continue laxatives as scheduled.    Disposable Sitz Bath A disposable sitz bath is a plastic basin that fits over the toilet. A bag is hung above the toilet, and the bag is connected to a tube that opens into the basin. The bag is filled with warm water that flows into the basin through the tube. A sitz bath can be used to help relieve symptoms, clean, and promote healing in the genital and anal areas, as well as in the lower abdomen and buttocks. What are the risks? Sitz baths are generally very safe. It is possible for the skin between the genitals and the anus (perineum) to become infected, but this is rare. You can avoid this by cleaning your sitz bath supplies thoroughly. How to use a disposable sitz bath 1. Close the clamp on the tube. Make sure the clamp is closed tightly to prevent leakage. 2. Fill the sitz bath basin and the plastic bag with warm water. The water should be warm enough to be comfortable, but not hot. 3. Raise the toilet seat and place the filled basin on the toilet. Make sure the overflow opening is facing toward the back of the toilet. ? If you prefer, you may place the empty basin on the toilet first, and then use the plastic bag to fill the basin with warm water. 4. Hang the filled plastic bag overhead on a hook or towel rack close to the toilet. The bag should be higher than the toilet so that the water will flow down through the tube. 5. Attach the tube to the opening on the basin. Make sure that the tube is  attached to the basin tightly to prevent leakage. 6. Sit on the basin and release the clamp. This will allow warm water to flow into the basin and flush the area around your genitals and anus. 7. Remain sitting on the basin for about 15-20 minutes, or as long as told by your health care provider. 8. Stand up and gently pat your skin dry. If directed, apply clean bandages (dressings) to the affected area as told by your health care provider. 9. Carefully remove the basin from the toilet seat and tip the basin into the toilet to empty any remaining water. Empty any remaining water from the plastic bag into the toilet. Then, flush the toilet. 10. Wash the basin with warm water and soap. Let the basin air dry in the sink. You should also let the plastic bag and the tubing air dry. 11. Store the basin, tubing, and plastic bag in a clean, dry area. 12. Wash your hands with soap and water. If soap and water are not available, use hand sanitizer. Contact a health care provider if:  You have symptoms that get worse instead of better.  You develop new skin irritation, redness, or swelling around your genitals or anus. This information is not intended to replace advice given to you by your health care provider. Make sure you discuss any questions you have with your health care  provider. Document Released: 04/13/2012 Document Revised: 03/20/2016 Document Reviewed: 09/02/2015 Elsevier Interactive Patient Education  [employer].

## 2017-09-23 NOTE — Progress Notes (Signed)
I agree with the above note, plan 

## 2017-12-24 ENCOUNTER — Institutional Professional Consult (permissible substitution): Payer: Medicare Other | Admitting: Pulmonary Disease

## 2018-02-12 ENCOUNTER — Emergency Department (HOSPITAL_COMMUNITY): Payer: Medicare Other

## 2018-02-12 ENCOUNTER — Inpatient Hospital Stay (HOSPITAL_COMMUNITY)
Admission: EM | Admit: 2018-02-12 | Discharge: 2018-02-15 | DRG: 100 | Disposition: A | Discharge status: Skilled Nursing Facility | Payer: Medicare Other | Attending: Family Medicine | Admitting: Family Medicine

## 2018-02-12 ENCOUNTER — Encounter (HOSPITAL_COMMUNITY): Payer: Self-pay | Admitting: Emergency Medicine

## 2018-02-12 DIAGNOSIS — R52 Pain, unspecified: Secondary | ICD-10-CM

## 2018-02-12 DIAGNOSIS — H409 Unspecified glaucoma: Secondary | ICD-10-CM | POA: Diagnosis present

## 2018-02-12 DIAGNOSIS — R569 Unspecified convulsions: Principal | ICD-10-CM | POA: Diagnosis present

## 2018-02-12 DIAGNOSIS — R269 Unspecified abnormalities of gait and mobility: Secondary | ICD-10-CM | POA: Diagnosis present

## 2018-02-12 DIAGNOSIS — F411 Generalized anxiety disorder: Secondary | ICD-10-CM | POA: Diagnosis present

## 2018-02-12 DIAGNOSIS — N179 Acute kidney failure, unspecified: Secondary | ICD-10-CM | POA: Diagnosis present

## 2018-02-12 DIAGNOSIS — Z79899 Other long term (current) drug therapy: Secondary | ICD-10-CM

## 2018-02-12 DIAGNOSIS — I739 Peripheral vascular disease, unspecified: Secondary | ICD-10-CM | POA: Diagnosis present

## 2018-02-12 DIAGNOSIS — R4702 Dysphasia: Secondary | ICD-10-CM | POA: Diagnosis present

## 2018-02-12 DIAGNOSIS — Y92129 Unspecified place in nursing home as the place of occurrence of the external cause: Secondary | ICD-10-CM

## 2018-02-12 DIAGNOSIS — Z87440 Personal history of urinary (tract) infections: Secondary | ICD-10-CM

## 2018-02-12 DIAGNOSIS — I161 Hypertensive emergency: Secondary | ICD-10-CM | POA: Diagnosis not present

## 2018-02-12 DIAGNOSIS — F0150 Vascular dementia without behavioral disturbance: Secondary | ICD-10-CM | POA: Diagnosis present

## 2018-02-12 DIAGNOSIS — I509 Heart failure, unspecified: Secondary | ICD-10-CM | POA: Diagnosis present

## 2018-02-12 DIAGNOSIS — Z888 Allergy status to other drugs, medicaments and biological substances status: Secondary | ICD-10-CM

## 2018-02-12 DIAGNOSIS — K5900 Constipation, unspecified: Secondary | ICD-10-CM | POA: Diagnosis present

## 2018-02-12 DIAGNOSIS — Z66 Do not resuscitate: Secondary | ICD-10-CM | POA: Diagnosis present

## 2018-02-12 DIAGNOSIS — T404X5A Adverse effect of other synthetic narcotics, initial encounter: Secondary | ICD-10-CM | POA: Diagnosis present

## 2018-02-12 DIAGNOSIS — F329 Major depressive disorder, single episode, unspecified: Secondary | ICD-10-CM | POA: Diagnosis present

## 2018-02-12 DIAGNOSIS — Z89611 Acquired absence of right leg above knee: Secondary | ICD-10-CM | POA: Diagnosis not present

## 2018-02-12 DIAGNOSIS — Z87891 Personal history of nicotine dependence: Secondary | ICD-10-CM | POA: Diagnosis not present

## 2018-02-12 DIAGNOSIS — F05 Delirium due to known physiological condition: Secondary | ICD-10-CM | POA: Diagnosis present

## 2018-02-12 DIAGNOSIS — IMO0002 Reserved for concepts with insufficient information to code with codable children: Secondary | ICD-10-CM

## 2018-02-12 DIAGNOSIS — I4581 Long QT syndrome: Secondary | ICD-10-CM | POA: Diagnosis present

## 2018-02-12 DIAGNOSIS — D638 Anemia in other chronic diseases classified elsewhere: Secondary | ICD-10-CM | POA: Diagnosis present

## 2018-02-12 DIAGNOSIS — I160 Hypertensive urgency: Secondary | ICD-10-CM | POA: Diagnosis present

## 2018-02-12 DIAGNOSIS — Z885 Allergy status to narcotic agent status: Secondary | ICD-10-CM

## 2018-02-12 DIAGNOSIS — K219 Gastro-esophageal reflux disease without esophagitis: Secondary | ICD-10-CM | POA: Diagnosis present

## 2018-02-12 DIAGNOSIS — Z85118 Personal history of other malignant neoplasm of bronchus and lung: Secondary | ICD-10-CM | POA: Diagnosis not present

## 2018-02-12 DIAGNOSIS — Z801 Family history of malignant neoplasm of trachea, bronchus and lung: Secondary | ICD-10-CM

## 2018-02-12 DIAGNOSIS — I130 Hypertensive heart and chronic kidney disease with heart failure and stage 1 through stage 4 chronic kidney disease, or unspecified chronic kidney disease: Secondary | ICD-10-CM | POA: Diagnosis present

## 2018-02-12 DIAGNOSIS — M109 Gout, unspecified: Secondary | ICD-10-CM | POA: Diagnosis present

## 2018-02-12 DIAGNOSIS — G9341 Metabolic encephalopathy: Secondary | ICD-10-CM | POA: Diagnosis present

## 2018-02-12 DIAGNOSIS — R4182 Altered mental status, unspecified: Secondary | ICD-10-CM

## 2018-02-12 DIAGNOSIS — Z79891 Long term (current) use of opiate analgesic: Secondary | ICD-10-CM

## 2018-02-12 DIAGNOSIS — D649 Anemia, unspecified: Secondary | ICD-10-CM | POA: Diagnosis present

## 2018-02-12 DIAGNOSIS — Z7902 Long term (current) use of antithrombotics/antiplatelets: Secondary | ICD-10-CM

## 2018-02-12 DIAGNOSIS — N183 Chronic kidney disease, stage 3 (moderate): Secondary | ICD-10-CM | POA: Diagnosis present

## 2018-02-12 LAB — URINALYSIS, COMPLETE (UACMP) WITH MICROSCOPIC
BACTERIA UA: NONE SEEN
BILIRUBIN URINE: NEGATIVE
GLUCOSE, UA: NEGATIVE mg/dL
Hgb urine dipstick: NEGATIVE
Ketones, ur: NEGATIVE mg/dL
LEUKOCYTES UA: NEGATIVE
Nitrite: NEGATIVE
PROTEIN: 30 mg/dL — AB
Specific Gravity, Urine: 1.011 (ref 1.005–1.030)
pH: 6 (ref 5.0–8.0)

## 2018-02-12 LAB — CBC WITH DIFFERENTIAL/PLATELET
BASOS PCT: 0 %
Basophils Absolute: 0 10*3/uL (ref 0.0–0.1)
EOS ABS: 0.2 10*3/uL (ref 0.0–0.7)
Eosinophils Relative: 4 %
HEMATOCRIT: 30.1 % — AB (ref 36.0–46.0)
HEMOGLOBIN: 9.4 g/dL — AB (ref 12.0–15.0)
Lymphocytes Relative: 20 %
Lymphs Abs: 0.9 10*3/uL (ref 0.7–4.0)
MCH: 30.4 pg (ref 26.0–34.0)
MCHC: 31.2 g/dL (ref 30.0–36.0)
MCV: 97.4 fL (ref 78.0–100.0)
Monocytes Absolute: 0.2 10*3/uL (ref 0.1–1.0)
Monocytes Relative: 4 %
NEUTROS ABS: 3.5 10*3/uL (ref 1.7–7.7)
NEUTROS PCT: 72 %
Platelets: 234 10*3/uL (ref 150–400)
RBC: 3.09 MIL/uL — ABNORMAL LOW (ref 3.87–5.11)
RDW: 14.6 % (ref 11.5–15.5)
WBC: 4.8 10*3/uL (ref 4.0–10.5)

## 2018-02-12 LAB — MRSA PCR SCREENING: MRSA by PCR: NEGATIVE

## 2018-02-12 LAB — COMPREHENSIVE METABOLIC PANEL
ALK PHOS: 58 U/L (ref 38–126)
ALT: 5 U/L — AB (ref 14–54)
ANION GAP: 7 (ref 5–15)
AST: 15 U/L (ref 15–41)
Albumin: 2.5 g/dL — ABNORMAL LOW (ref 3.5–5.0)
BUN: 42 mg/dL — ABNORMAL HIGH (ref 6–20)
CALCIUM: 8.9 mg/dL (ref 8.9–10.3)
CO2: 25 mmol/L (ref 22–32)
CREATININE: 1.65 mg/dL — AB (ref 0.44–1.00)
Chloride: 110 mmol/L (ref 101–111)
GFR, EST AFRICAN AMERICAN: 32 mL/min — AB (ref >60)
GFR, EST NON AFRICAN AMERICAN: 27 mL/min — AB (ref >60)
Glucose, Bld: 93 mg/dL (ref 65–99)
Potassium: 4.8 mmol/L (ref 3.5–5.1)
SODIUM: 142 mmol/L (ref 135–145)
Total Bilirubin: 0.3 mg/dL (ref 0.3–1.2)
Total Protein: 6.3 g/dL — ABNORMAL LOW (ref 6.5–8.1)

## 2018-02-12 LAB — CBG MONITORING, ED
Glucose-Capillary: 92 mg/dL (ref 65–99)
Glucose-Capillary: 96 mg/dL (ref 65–99)

## 2018-02-12 LAB — I-STAT CG4 LACTIC ACID, ED: Lactic Acid, Venous: 1.22 mmol/L (ref 0.5–1.9)

## 2018-02-12 MED ORDER — SENNOSIDES-DOCUSATE SODIUM 8.6-50 MG PO TABS
1.0000 | ORAL_TABLET | Freq: Two times a day (BID) | ORAL | Status: DC
  Administered 2018-02-12 – 2018-02-15 (×5): 1 via ORAL
  Filled 2018-02-12 (×5): qty 1

## 2018-02-12 MED ORDER — FLEET ENEMA 7-19 GM/118ML RE ENEM
1.0000 | ENEMA | Freq: Once | RECTAL | Status: DC
  Filled 2018-02-12 (×2): qty 1

## 2018-02-12 MED ORDER — MORPHINE SULFATE (PF) 4 MG/ML IV SOLN: 2.0000 mg | Freq: Once | INTRAVENOUS | Status: DC

## 2018-02-12 MED ORDER — GABAPENTIN 300 MG PO CAPS
300.0000 mg | ORAL_CAPSULE | Freq: Two times a day (BID) | ORAL | Status: DC
  Administered 2018-02-12 – 2018-02-15 (×5): 300 mg via ORAL
  Filled 2018-02-12 (×7): qty 1

## 2018-02-12 MED ORDER — SODIUM CHLORIDE 0.9 % IV BOLUS
1000.0000 mL | Freq: Once | INTRAVENOUS | Status: AC
  Administered 2018-02-12: 1000 mL via INTRAVENOUS

## 2018-02-12 MED ORDER — FERROUS SULFATE 325 (65 FE) MG PO TABS
325.0000 mg | ORAL_TABLET | Freq: Two times a day (BID) | ORAL | Status: DC
  Administered 2018-02-13 – 2018-02-15 (×2): 325 mg via ORAL
  Filled 2018-02-12 (×3): qty 1

## 2018-02-12 MED ORDER — PANTOPRAZOLE SODIUM 40 MG PO TBEC
40.0000 mg | DELAYED_RELEASE_TABLET | Freq: Every day | ORAL | Status: DC
  Administered 2018-02-13 – 2018-02-15 (×2): 40 mg via ORAL
  Filled 2018-02-12 (×2): qty 1

## 2018-02-12 MED ORDER — BUSPIRONE HCL 5 MG PO TABS
5.0000 mg | ORAL_TABLET | Freq: Every day | ORAL | Status: DC
  Administered 2018-02-12 – 2018-02-14 (×3): 5 mg via ORAL
  Filled 2018-02-12 (×3): qty 1

## 2018-02-12 MED ORDER — SODIUM BICARBONATE 650 MG PO TABS
650.0000 mg | ORAL_TABLET | Freq: Two times a day (BID) | ORAL | Status: DC
  Administered 2018-02-12 – 2018-02-15 (×5): 650 mg via ORAL
  Filled 2018-02-12 (×5): qty 1

## 2018-02-12 MED ORDER — LORAZEPAM 2 MG/ML IJ SOLN
0.5000 mg | Freq: Once | INTRAMUSCULAR | Status: AC
  Administered 2018-02-12: 0.5 mg via INTRAVENOUS

## 2018-02-12 MED ORDER — HYDROCODONE-ACETAMINOPHEN 5-325 MG PO TABS
1.0000 | ORAL_TABLET | Freq: Four times a day (QID) | ORAL | Status: DC | PRN
  Administered 2018-02-14 – 2018-02-15 (×2): 2 via ORAL
  Filled 2018-02-12 (×2): qty 2
  Filled 2018-02-12: qty 1

## 2018-02-12 MED ORDER — CLOPIDOGREL BISULFATE 75 MG PO TABS
75.0000 mg | ORAL_TABLET | Freq: Every day | ORAL | Status: DC
  Administered 2018-02-12 – 2018-02-15 (×3): 75 mg via ORAL
  Filled 2018-02-12 (×4): qty 1

## 2018-02-12 MED ORDER — DONEPEZIL HCL 10 MG PO TABS
10.0000 mg | ORAL_TABLET | Freq: Every day | ORAL | Status: DC
  Administered 2018-02-12 – 2018-02-14 (×3): 10 mg via ORAL
  Filled 2018-02-12 (×3): qty 1

## 2018-02-12 MED ORDER — ALLOPURINOL 100 MG PO TABS
50.0000 mg | ORAL_TABLET | Freq: Every day | ORAL | Status: DC
  Administered 2018-02-12 – 2018-02-15 (×3): 50 mg via ORAL
  Filled 2018-02-12 (×4): qty 1

## 2018-02-12 MED ORDER — SODIUM CHLORIDE 0.9 % IV SOLN
INTRAVENOUS | Status: DC
  Administered 2018-02-12 – 2018-02-13 (×4): via INTRAVENOUS

## 2018-02-12 MED ORDER — HYDRALAZINE HCL 20 MG/ML IJ SOLN
10.0000 mg | Freq: Four times a day (QID) | INTRAMUSCULAR | Status: DC | PRN
  Administered 2018-02-13 – 2018-02-14 (×4): 10 mg via INTRAVENOUS
  Filled 2018-02-12 (×5): qty 1

## 2018-02-12 MED ORDER — HYDRALAZINE HCL 50 MG PO TABS
50.0000 mg | ORAL_TABLET | Freq: Three times a day (TID) | ORAL | Status: DC
  Administered 2018-02-12: 50 mg via ORAL
  Filled 2018-02-12 (×2): qty 1

## 2018-02-12 MED ORDER — LORAZEPAM 2 MG/ML IJ SOLN
INTRAMUSCULAR | Status: AC
  Administered 2018-02-12: 0.5 mg via INTRAVENOUS
  Filled 2018-02-12: qty 1

## 2018-02-12 MED ORDER — MIRTAZAPINE 15 MG PO TABS: 30.0000 mg | ORAL_TABLET | Freq: Every day | ORAL | Status: DC

## 2018-02-12 MED ORDER — VITAMIN D3 25 MCG (1000 UNIT) PO TABS
1000.0000 [IU] | ORAL_TABLET | Freq: Every day | ORAL | Status: DC
  Administered 2018-02-12 – 2018-02-15 (×3): 1000 [IU] via ORAL
  Filled 2018-02-12 (×4): qty 1

## 2018-02-12 MED ORDER — HEPARIN SODIUM (PORCINE) 5000 UNIT/ML IJ SOLN
5000.0000 [IU] | Freq: Three times a day (TID) | INTRAMUSCULAR | Status: DC
  Administered 2018-02-12 – 2018-02-15 (×7): 5000 [IU] via SUBCUTANEOUS
  Filled 2018-02-12 (×7): qty 1

## 2018-02-12 MED ORDER — BUSPIRONE HCL 10 MG PO TABS
10.0000 mg | ORAL_TABLET | Freq: Every day | ORAL | Status: DC
  Administered 2018-02-13 – 2018-02-15 (×2): 10 mg via ORAL
  Filled 2018-02-12 (×3): qty 1

## 2018-02-12 MED ORDER — LABETALOL HCL 300 MG PO TABS
300.0000 mg | ORAL_TABLET | Freq: Three times a day (TID) | ORAL | Status: DC
  Administered 2018-02-12 – 2018-02-15 (×8): 300 mg via ORAL
  Filled 2018-02-12 (×10): qty 1

## 2018-02-12 MED ORDER — LEVETIRACETAM IN NACL 500 MG/100ML IV SOLN
500.0000 mg | Freq: Two times a day (BID) | INTRAVENOUS | Status: DC
  Administered 2018-02-12 – 2018-02-15 (×7): 500 mg via INTRAVENOUS
  Filled 2018-02-12 (×8): qty 100

## 2018-02-12 MED ORDER — HYDRALAZINE HCL 20 MG/ML IJ SOLN
10.0000 mg | INTRAMUSCULAR | Status: AC
  Administered 2018-02-12: 10 mg via INTRAVENOUS
  Filled 2018-02-12: qty 1

## 2018-02-12 MED ORDER — ACETAMINOPHEN 500 MG PO TABS
1000.0000 mg | ORAL_TABLET | Freq: Once | ORAL | Status: DC
  Filled 2018-02-12: qty 2

## 2018-02-12 MED ORDER — TRAMADOL HCL 50 MG PO TABS: 50.0000 mg | ORAL_TABLET | Freq: Once | ORAL | Status: DC

## 2018-02-12 NOTE — ED Notes (Signed)
Attempted to call report

## 2018-02-12 NOTE — ED Notes (Signed)
PICC has been X-Ray and ED MD Vanita Panda has cleared RN for use of PICC Line.

## 2018-02-12 NOTE — ED Notes (Signed)
Urine culture sent with urinalysis sample to lab.

## 2018-02-12 NOTE — ED Notes (Signed)
Pt has been cleaned and repositioned. Upon assessment the pt does appear to have a fecal Impaction. ED Provider has been made aware and Pressure Sore has been assessed.

## 2018-02-12 NOTE — H&P (Signed)
History and Physical  Elizabeth Daniels GEX:[id number] DOB: 05-31-1931 DOA: 02/12/2018  Referring physician: Dr Vanita Panda PCP: Garwin Brothers, MD  Outpatient Specialists:  Patient coming from: SNF  Chief Complaint: AMS, Seizure  HPI: Elizabeth Daniels is a 84 y.o. female with medical history significant for chronic depression/anxiety, hypertension, PVD status post right above-the-knee amputation, CHF, GERD, dysphagia, GI bleed who presented to the [facility] ED from SNF with altered mental status.  Unable to obtain history from the patient due to somnolence and altered mental status.  Per ED physician the patient had new onset seizures.  Neurology was curbsided and recommended administration of load of Keppra.   ED Course: Accelerated hypertension.  Elevated creatinine on chemistry panel.  Review of Systems: Unable to obtain a review of systems due to the patient's somnolence and altered mental status.   Past Medical History:  Diagnosis Date  . Anemia   . CHF (congestive heart failure) ([place]) 10/02/2015  . Dysphagia 10/02/2015  . Generalized anxiety disorder 10/02/2015  . GERD (gastroesophageal reflux disease) 10/02/2015  . Glaucoma 10/02/2015  . Gout 10/02/2015  . Hypertension   . Lung cancer ([place])   . PVD (peripheral vascular disease) ([place]) 10/02/2015   Past Surgical History:  Procedure Laterality Date  . COLONOSCOPY WITH PROPOFOL N/A 10/03/2015   Procedure: COLONOSCOPY WITH PROPOFOL;  Surgeon: Ladene Artist, MD;  Location: [facility];  Service: Endoscopy;  Laterality: N/A;  . right leg amputation     07/2014    Social History:  reports that she has quit smoking. She has never used smokeless tobacco. She reports that she does not drink alcohol or use drugs.   Allergies  Allergen Reactions  . Quinine Derivatives Rash    MAR  . Codeine     MAR    Family History  Problem Relation Age of Onset  . Lung cancer Brother      Prior to Admission medications   Medication Sig Start  Date End Date Taking? Authorizing Provider  acetaminophen (TYLENOL) 325 MG tablet Take 650 mg by mouth every 8 (eight) hours as needed for fever.   Yes [provider]  acetaminophen (TYLENOL) 500 MG tablet Take 500 mg by mouth every morning.   Yes [provider]  allopurinol (ZYLOPRIM) 100 MG tablet Take 50 mg by mouth daily.    Yes [provider]  alum & mag hydroxide-simeth (MYLANTA) 200-200-20 MG/5ML suspension Take 30 mLs by mouth 3 (three) times daily.    Yes [provider]  ascorbic acid (VITAMIN C) 500 MG tablet Take 500 mg by mouth daily.   Yes [provider]  busPIRone (BUSPAR) 5 MG tablet Take 5-10 mg by mouth 2 (two) times daily. Take 2 tablets (10mg ) in the morning and take 1 tablet (5mg ) at bedtime   Yes [provider]  cholecalciferol (VITAMIN D) 1000 UNITS tablet Take 1,000 Units by mouth daily.   Yes [provider]  clotrimazole (MYCELEX) 10 MG troche Take 10 mg by mouth 2 (two) times daily.   Yes [provider]  docusate sodium (COLACE) 100 MG capsule Take 100 mg by mouth 2 (two) times daily.    Yes [provider]  donepezil (ARICEPT) 10 MG tablet Take 10 mg by mouth at bedtime.   Yes [provider]  ferrous sulfate 325 (65 FE) MG tablet Take 325 mg by mouth 2 (two) times daily with a meal.   Yes [provider]  gabapentin (NEURONTIN) 300 MG capsule  Take 300 mg by mouth 2 (two) times daily.    Yes [provider]  hydrALAZINE (APRESOLINE) 50 MG tablet Take 50 mg by mouth 3 (three) times daily.   Yes [provider]  HYDROcodone-acetaminophen (NORCO/VICODIN) 5-325 MG tablet Take 1 tablet by mouth every 6 (six) hours as needed for moderate pain.   Yes [provider]  hydrocortisone (ANUSOL-HC) 2.5 % rectal cream Place 1 application rectally every 12 (twelve) hours as needed for hemorrhoids.   Yes [provider]  ipratropium-albuterol  (DUONEB) 0.5-2.5 (3) MG/3ML SOLN Take 3 mLs by nebulization every 4 (four) hours as needed (pneumonia).   Yes [provider]  labetalol (NORMODYNE) 300 MG tablet Take 300 mg by mouth 3 (three) times daily.   Yes [provider]  latanoprost (XALATAN) 0.005 % ophthalmic solution Place 1 drop into both eyes at bedtime.    Yes [provider]  mirtazapine (REMERON) 30 MG tablet Take 30 mg by mouth at bedtime.   Yes [provider]  Nutritional Supplements (ENSURE CLEAR) LIQD Take 237 mLs by mouth 2 (two) times daily.   Yes [provider]  ondansetron (ZOFRAN) 4 MG tablet Take 4 mg by mouth every 6 (six) hours as needed for nausea or vomiting.   Yes [provider]  pantoprazole (PROTONIX) 40 MG tablet Take 1 tablet (40 mg total) by mouth 2 (two) times daily. Patient taking differently: Take 40 mg by mouth daily.  05/18/17  Yes Ladene Artist, MD  polyethylene glycol [facility] / Floria Raveling) packet Take 17 g by mouth daily.   Yes [provider]  Propylene Glycol (SYSTANE BALANCE OP) Place 1 drop into both eyes 2 (two) times daily.    Yes [provider]  saccharomyces boulardii (FLORASTOR) 250 MG capsule Take 250 mg by mouth 2 (two) times daily.   Yes [provider]  senna-docusate (SENOKOT-S) 8.6-50 MG tablet Take 1 tablet by mouth 2 (two) times daily.   Yes [provider]  sodium bicarbonate 650 MG tablet Take 1 tablet (650 mg total) by mouth 2 (two) times daily. 05/25/17  Yes Regalado, Belkys A, MD  torsemide (DEMADEX) 10 MG tablet Take 10 mg by mouth daily.   Yes [provider]  traMADol (ULTRAM) 50 MG tablet Take 50 mg by mouth 3 (three) times daily.    Yes [provider]  vitamin B-12 (CYANOCOBALAMIN) 500 MCG tablet Take 500 mcg by mouth daily.   Yes [provider]  AMBULATORY NON FORMULARY MEDICATION Place 0.125 mg rectally 3 (three) times daily. Medication Name: nitroglycerin  ointment 0.125 mg Patient not taking: Reported on 02/12/2018 09/23/17   Levin Erp, PA  clopidogrel (PLAVIX) 75 MG tablet Take 1 tablet (75 mg total) by mouth daily. 10/08/15   Donne Hazel, MD  oseltamivir (TAMIFLU) 30 MG capsule Take 30 mg by mouth daily.    [provider]    Physical Exam: BP (!) 190/77 (BP Location: Left Arm)   Pulse 87   Temp 98.8 F (37.1 C)   Resp 14   Wt 48.7 kg (107 lb 4.8 oz)   SpO2 96%   BMI 17.32 kg/m   General: 84 year old [place] American female, frail.  Somnolent.  Arouses to pain stimuli. Eyes: Anicteric sclera ENT: Mucous membranes dry with no erythema or exudates Neck: No JVD or thyromegaly. Cardiovascular: Regular rate and rhythm with no rubs or gallops. Respiratory: Clear to auscultation with no wheezes or rales Abdomen: Soft nontender  nondistended with normal bowel sounds x4 Skin: No noted ulcerative lesions Musculoskeletal: Right above-the-knee amputation Psychiatric: Unable to assess due to somnolence Neurologic: Unable to assess due to somnolence          Labs on Admission:  Basic Metabolic Panel: Recent Labs  Lab 02/12/18 0746  NA 142  K 4.8  CL 110  CO2 25  GLUCOSE 93  BUN 42*  CREATININE 1.65*  CALCIUM 8.9   Liver Function Tests: Recent Labs  Lab 02/12/18 0746  AST 15  ALT 5*  ALKPHOS 58  BILITOT 0.3  PROT 6.3*  ALBUMIN 2.5*   No results for input(s): LIPASE, AMYLASE in the last 168 hours. No results for input(s): AMMONIA in the last 168 hours. CBC: Recent Labs  Lab 02/12/18 0746  WBC 4.8  NEUTROABS 3.5  HGB 9.4*  HCT 30.1*  MCV 97.4  PLT 234   Cardiac Enzymes: No results for input(s): CKTOTAL, CKMB, CKMBINDEX, TROPONINI in the last 168 hours.  BNP (last 3 results) Recent Labs    05/23/17 0505 05/24/17 0500 05/25/17 0507  BNP 835.6* 722.8* 1,122.0*    ProBNP (last 3 results) No results for input(s): PROBNP in the last 8760 hours.  CBG: Recent Labs  Lab  02/12/18 0734 02/12/18 1249  GLUCAP 92 96    Radiological Exams on Admission: Ct Head Wo Contrast  Result Date: 02/12/2018 CLINICAL DATA:  Altered level of consciousness EXAM: CT HEAD WITHOUT CONTRAST TECHNIQUE: Contiguous axial images were obtained from the base of the skull through the vertex without intravenous contrast. COMPARISON:  05/22/2017 FINDINGS: Brain: There is atrophy and chronic small vessel disease changes. No acute intracranial abnormality. Specifically, no hemorrhage, hydrocephalus, mass lesion, acute infarction, or significant intracranial injury. Vascular: No hyperdense vessel or unexpected calcification. Skull: No acute calvarial abnormality. Sinuses/Orbits: Visualized paranasal sinuses and mastoids clear. Orbital soft tissues unremarkable. Other: None IMPRESSION: No acute intracranial abnormality. Atrophy, chronic microvascular disease. Electronically Signed   By: Rolm Baptise M.D.   On: 02/12/2018 09:18   Dg Chest Port 1 View  Result Date: 02/12/2018 CLINICAL DATA:  Altered mental status EXAM: PORTABLE CHEST 1 VIEW COMPARISON:  May 24, 2017 FINDINGS: There is a small right pleural effusion. There is no edema or consolidation. There is bibasilar atelectasis. There is mild cardiomegaly with pulmonary vascularity normal. Aorta is tortuous with aortic atherosclerosis. No adenopathy. There is a central catheter with tip at the cavoatrial junction. No pneumothorax. There is erosion of both distal clavicles. There is marked superior migration of the humeral heads to the levels of the eroded distal clavicles, stable. There is calcification in both carotid arteries. IMPRESSION: Small right pleural effusion.  Bibasilar atelectasis. Stable cardiac prominence. Tortuous aorta with aortic atherosclerosis. Central catheter tip at cavoatrial junction. Chronic shoulder dislocations with superior migration of both humeral heads and erosion of both distal clavicles. Chronic rotator cuff tears  inferred by these findings. There is calcification in both carotid arteries. Aortic Atherosclerosis (ICD10-I70.0). Electronically Signed   By: Lowella Grip III M.D.   On: 02/12/2018 07:36    Assessment/Plan Present on Admission: **None**  Active Problems:   New onset seizure ([place])  Acute metabolic encephalopathy secondary to new onset seizure CT head negative urine analysis negative Loads of Keppra given Continue to monitor closely May consider consulting neurology in the morning Suspect secondary to tramadol toxicity Hold tramadol Neurochecks every 4 hours  CKD 3 At her baseline of 1.6 Avoid nephrotoxic agents/Hypotension/dehydration Repeat chemistry panel in the morning  Anemia  of chronic disease Hemoglobin 9.4 Baseline hemoglobin 10 No sign of overt bleeding CBC in the morning  Ambulatory dysfunction Fall precautions in place  Dysphasia Speech therapist for swallow evaluation On puree And thin liquid    DVT prophylaxis: Heparin subcu 3 times daily  Code Status: DNR  Family Communication: Daughter at bedside  Disposition Plan: Admit to telemetry  Consults called: None  Admission status: Inpatient    Kayleen Memos MD [facility] Pager [phone]  If 7PM-7AM, please contact night-coverage [email or web address] Password [facility]  02/12/2018, 6:39 PM

## 2018-02-12 NOTE — ED Notes (Signed)
Jolene RN doctor's office verbalizes pt placed on tramadol every 8 hours and may be causing some of the sedation and elevated kidney function. Vanita Panda MD made aware.

## 2018-02-12 NOTE — Progress Notes (Signed)
Upon initial assessment, patient was noted to be impacted with stool. RN manually disimpacted patient and was able to remove a moderate amount of hard, black stool. Patient complained of what appeared to be abdominal cramping after disimpaction. MD made aware of findings. Will continue to monitor for constipation.   Othella Boyer [facility] 02/12/2018 7:46 PM

## 2018-02-12 NOTE — ED Triage Notes (Signed)
Pt comes from [facility], pt not acting right according to health worker. Pts last know well is 1 hr ago. V/s on arrival 184/87, cbg92, pluse 74, 96 room air. 22 left thumb.  picc line in right arm. Not know last access.

## 2018-02-12 NOTE — ED Notes (Signed)
Hospitalist at bedside 

## 2018-02-12 NOTE — ED Notes (Signed)
Primary Care Provider Nurse Practitioner Linton Rump from [facility] has checked on Pt and has been provided an update from RN and ED Provider.

## 2018-02-12 NOTE — ED Notes (Signed)
Bed: WA17 Expected date:  Expected time:  Means of arrival:  Comments: EMS 84 yo female altered LOC

## 2018-02-12 NOTE — ED Notes (Signed)
ED TO INPATIENT HANDOFF REPORT  Name/Age/Gender Elizabeth Daniels 84 y.o. female  Code Status Code Status History    Date Active Date Inactive Code Status Order ID Comments User Context   05/22/2017 2154 05/25/2017 1827 DNR [id number]  Domenic Polite, MD Inpatient   09/30/2015 1137 10/04/2015 1845 Full Code [id number]  Rush Farmer, MD ED    Questions for Most Recent Historical Code Status (Order [id number])    Question Answer Comment   In the event of cardiac or respiratory ARREST Do not call a "code blue"    In the event of cardiac or respiratory ARREST Do not perform Intubation, CPR, defibrillation or ACLS    In the event of cardiac or respiratory ARREST Use medication by any route, position, wound care, and other measures to relive pain and suffering. May use oxygen, suction and manual treatment of airway obstruction as needed for comfort.       Home/SNF/Other Home  Chief Complaint AMS  Level of Care/Admitting Diagnosis ED Disposition    ED Disposition Condition Comment   Admit  Hospital Area: [facility] [[id number]]  Level of Care: Telemetry [5]  Admit to tele based on following criteria: Monitor for Ischemic changes  Diagnosis: New onset seizure [facility]) [[id number]]  Admitting Physician: Kayleen Memos [[id number]]  Attending Physician: Kayleen Memos [[id number]]  Estimated length of stay: past midnight tomorrow  Certification:: I certify this patient will need inpatient services for at least 2 midnights  PT Class (Do Not Modify): Inpatient [101]  PT Acc Code (Do Not Modify): Private [1]       Medical History Past Medical History:  Diagnosis Date  . Anemia   . CHF (congestive heart failure) ([place]) 10/02/2015  . Dysphagia 10/02/2015  . Generalized anxiety disorder 10/02/2015  . GERD (gastroesophageal reflux disease) 10/02/2015  . Glaucoma 10/02/2015  . Gout 10/02/2015  . Hypertension   . Lung cancer ([place])   . PVD (peripheral vascular disease) ([place]) 10/02/2015     Allergies Allergies  Allergen Reactions  . Quinine Derivatives Rash    MAR  . Codeine     MAR    IV Location/Drains/Wounds Patient Lines/Drains/Airways Status   Active Line/Drains/Airways    Name:   Placement date:   Placement time:   Site:   Days:   Peripheral IV 02/12/18 Left Hand   02/12/18    0707    Hand   less than 1   PICC Single Lumen 02/12/18 PICC Right Brachial   02/12/18    1116    Brachial   less than 1   Pressure Injury 05/22/17 Stage II -  Partial thickness loss of dermis presenting as a shallow open ulcer with a red, pink wound bed without slough.   05/22/17    2030     266          Labs/Imaging Results for orders placed or performed during the hospital encounter of 02/12/18 (from the past 48 hour(s))  CBG monitoring, ED     Status: None   Collection Time: 02/12/18  7:34 AM  Result Value Ref Range   Glucose-Capillary 92 65 - 99 mg/dL  Comprehensive metabolic panel     Status: Abnormal   Collection Time: 02/12/18  7:46 AM  Result Value Ref Range   Sodium 142 135 - 145 mmol/L   Potassium 4.8 3.5 - 5.1 mmol/L   Chloride 110 101 - 111 mmol/L   CO2 25 22 - 32 mmol/L   Glucose,  Bld 93 65 - 99 mg/dL   BUN 42 (H) 6 - 20 mg/dL   Creatinine, Ser 1.65 (H) 0.44 - 1.00 mg/dL   Calcium 8.9 8.9 - 10.3 mg/dL   Total Protein 6.3 (L) 6.5 - 8.1 g/dL   Albumin 2.5 (L) 3.5 - 5.0 g/dL   AST 15 15 - 41 U/L   ALT 5 (L) 14 - 54 U/L   Alkaline Phosphatase 58 38 - 126 U/L   Total Bilirubin 0.3 0.3 - 1.2 mg/dL   GFR calc non Af Amer 27 (L) >60 mL/min   GFR calc Af Amer 32 (L) >60 mL/min    Comment: (NOTE) The eGFR has been calculated using the CKD EPI equation. This calculation has not been validated in all clinical situations. eGFR's persistently <60 mL/min signify possible Chronic Kidney Disease.    Anion gap 7 5 - 15    Comment: Performed at [facility], [place] [address], [place], [place] [address]  CBC WITH DIFFERENTIAL     Status: Abnormal    Collection Time: 02/12/18  7:46 AM  Result Value Ref Range   WBC 4.8 4.0 - 10.5 K/uL   RBC 3.09 (L) 3.87 - 5.11 MIL/uL   Hemoglobin 9.4 (L) 12.0 - 15.0 g/dL   HCT 30.1 (L) 36.0 - 46.0 %   MCV 97.4 78.0 - 100.0 fL   MCH 30.4 26.0 - 34.0 pg   MCHC 31.2 30.0 - 36.0 g/dL   RDW 14.6 11.5 - 15.5 %   Platelets 234 150 - 400 K/uL   Neutrophils Relative % 72 %   Neutro Abs 3.5 1.7 - 7.7 K/uL   Lymphocytes Relative 20 %   Lymphs Abs 0.9 0.7 - 4.0 K/uL   Monocytes Relative 4 %   Monocytes Absolute 0.2 0.1 - 1.0 K/uL   Eosinophils Relative 4 %   Eosinophils Absolute 0.2 0.0 - 0.7 K/uL   Basophils Relative 0 %   Basophils Absolute 0.0 0.0 - 0.1 K/uL    Comment: Performed at [facility], [place] [address], [place], [place] [address]  I-Stat CG4 Lactic Acid, ED     Status: None   Collection Time: 02/12/18  7:54 AM  Result Value Ref Range   Lactic Acid, Venous 1.22 0.5 - 1.9 mmol/L  Urinalysis, Complete w Microscopic     Status: Abnormal   Collection Time: 02/12/18 10:07 AM  Result Value Ref Range   Color, Urine YELLOW YELLOW   APPearance CLEAR CLEAR   Specific Gravity, Urine 1.011 1.005 - 1.030   pH 6.0 5.0 - 8.0   Glucose, UA NEGATIVE NEGATIVE mg/dL   Hgb urine dipstick NEGATIVE NEGATIVE   Bilirubin Urine NEGATIVE NEGATIVE   Ketones, ur NEGATIVE NEGATIVE mg/dL   Protein, ur 30 (A) NEGATIVE mg/dL   Nitrite NEGATIVE NEGATIVE   Leukocytes, UA NEGATIVE NEGATIVE   RBC / HPF 0-5 0 - 5 RBC/hpf   WBC, UA 0-5 0 - 5 WBC/hpf   Bacteria, UA NONE SEEN NONE SEEN   Squamous Epithelial / LPF 0-5 (A) NONE SEEN   Mucus PRESENT     Comment: Performed at [facility], [place] [address], [place], [place] [address]  POC CBG, ED     Status: None   Collection Time: 02/12/18 12:49 PM  Result Value Ref Range   Glucose-Capillary 96 65 - 99 mg/dL   Ct Head Wo Contrast  Result Date: 02/12/2018 CLINICAL DATA:  Altered level of consciousness EXAM: CT HEAD  WITHOUT CONTRAST  TECHNIQUE: Contiguous axial images were obtained from the base of the skull through the vertex without intravenous contrast. COMPARISON:  05/22/2017 FINDINGS: Brain: There is atrophy and chronic small vessel disease changes. No acute intracranial abnormality. Specifically, no hemorrhage, hydrocephalus, mass lesion, acute infarction, or significant intracranial injury. Vascular: No hyperdense vessel or unexpected calcification. Skull: No acute calvarial abnormality. Sinuses/Orbits: Visualized paranasal sinuses and mastoids clear. Orbital soft tissues unremarkable. Other: None IMPRESSION: No acute intracranial abnormality. Atrophy, chronic microvascular disease. Electronically Signed   By: Rolm Baptise M.D.   On: 02/12/2018 09:18   Dg Chest Port 1 View  Result Date: 02/12/2018 CLINICAL DATA:  Altered mental status EXAM: PORTABLE CHEST 1 VIEW COMPARISON:  May 24, 2017 FINDINGS: There is a small right pleural effusion. There is no edema or consolidation. There is bibasilar atelectasis. There is mild cardiomegaly with pulmonary vascularity normal. Aorta is tortuous with aortic atherosclerosis. No adenopathy. There is a central catheter with tip at the cavoatrial junction. No pneumothorax. There is erosion of both distal clavicles. There is marked superior migration of the humeral heads to the levels of the eroded distal clavicles, stable. There is calcification in both carotid arteries. IMPRESSION: Small right pleural effusion.  Bibasilar atelectasis. Stable cardiac prominence. Tortuous aorta with aortic atherosclerosis. Central catheter tip at cavoatrial junction. Chronic shoulder dislocations with superior migration of both humeral heads and erosion of both distal clavicles. Chronic rotator cuff tears inferred by these findings. There is calcification in both carotid arteries. Aortic Atherosclerosis (ICD10-I70.0). Electronically Signed   By: Lowella Grip III M.D.   On: 02/12/2018 07:36    Pending  Labs Unresulted Labs (From admission, onward)   None      Vitals/Pain Today's Vitals   02/12/18 1430 02/12/18 1445 02/12/18 1500 02/12/18 1515  BP: (!) 191/88 (!) 197/87 (!) 198/88 (!) 187/85  Pulse: 86 86 83 84  Resp: _0 Temp:      TempSrc:      SpO2: 98% 97% 95% 93%  PainSc:        Isolation Precautions No active isolations  Medications Medications  sodium chloride 0.9 % bolus 1,000 mL (0 mLs Intravenous Stopped 02/12/18 0849)    And  0.9 %  sodium chloride infusion ( Intravenous New Bag/Given 02/12/18 1513)  sodium phosphate (FLEET) 7-19 GM/118ML enema 1 enema (0 enemas Rectal Hold 02/12/18 1325)  acetaminophen (TYLENOL) tablet 1,000 mg (0 mg Oral Hold 02/12/18 1326)  levETIRAcetam (KEPPRA) IVPB 500 mg/100 mL premix (0 mg Intravenous Stopped 02/12/18 1512)  LORazepam (ATIVAN) injection 0.5 mg (0.5 mg Intravenous Given 02/12/18 1318)  hydrALAZINE (APRESOLINE) injection 10 mg (10 mg Intravenous Given 02/12/18 1512)    Mobility walks with person assist

## 2018-02-12 NOTE — ED Provider Notes (Signed)
[facility] DEPT Provider Note   CSN: [id number] Arrival date & time: 02/12/18  0606     History   Chief Complaint Chief Complaint  Patient presents with  . Altered Mental Status    HPI Elizabeth Daniels is a 84 y.o. female.  HPI Presents with her son who assists with the HPI. She has a history of vascular dementia, has been in a nursing facility for almost 1 decade. She has a history of recurrent urinary tract infection, and 1 week ago had both pneumonia and urinary tract infection. Over the past 12 hours the patient has become less interactive, according nursing home notes. The patient herself does answer questions briefly, with single word responses, denies pain, denies nausea, denies dyspnea. However, the patient's dementia limits the ability to contribute to the HPI, level 5 caveat. Patient's son provides much of the details, additional details obtained by nursing home notes.  Past Medical History:  Diagnosis Date  . Anemia   . CHF (congestive heart failure) ([place]) 10/02/2015  . Dysphagia 10/02/2015  . Generalized anxiety disorder 10/02/2015  . GERD (gastroesophageal reflux disease) 10/02/2015  . Glaucoma 10/02/2015  . Gout 10/02/2015  . Hypertension   . Lung cancer ([place])   . PVD (peripheral vascular disease) ([place]) 10/02/2015    Patient Active Problem List   Diagnosis Date Noted  . Pressure injury of skin 05/23/2017  . Aspiration pneumonia ([place]) 05/22/2017  . Gastrointestinal hemorrhage with melena   . Malnutrition of moderate degree 10/02/2015  . HTN (hypertension) 10/02/2015  . Syncope 10/02/2015  . Acute blood loss anemia 10/02/2015  . Acute kidney injury superimposed on CKD ([place]) 10/02/2015  . GERD (gastroesophageal reflux disease) 10/02/2015  . Glaucoma 10/02/2015  . Gout 10/02/2015  . PVD (peripheral vascular disease) ([place]) 10/02/2015  . Dysphagia 10/02/2015  . Generalized anxiety disorder 10/02/2015  . CHF (congestive heart  failure) ([place]) 10/02/2015  . Hematochezia   . Abnormal CT scan, colon   . GI bleed 09/30/2015    Past Surgical History:  Procedure Laterality Date  . COLONOSCOPY WITH PROPOFOL N/A 10/03/2015   Procedure: COLONOSCOPY WITH PROPOFOL;  Surgeon: Ladene Artist, MD;  Location: [facility] ENDOSCOPY;  Service: Endoscopy;  Laterality: N/A;  . right leg amputation     07/2014     OB History   None      Home Medications    Prior to Admission medications   Medication Sig Start Date End Date Taking? Authorizing Provider  acetaminophen (TYLENOL) 325 MG tablet Take 650 mg by mouth every 8 (eight) hours as needed for fever.   Yes [provider]  acetaminophen (TYLENOL) 500 MG tablet Take 500 mg by mouth every morning.   Yes [provider]  allopurinol (ZYLOPRIM) 100 MG tablet Take 50 mg by mouth daily.    Yes [provider]  alum & mag hydroxide-simeth (MYLANTA) 200-200-20 MG/5ML suspension Take 30 mLs by mouth 3 (three) times daily.    Yes [provider]  ascorbic acid (VITAMIN C) 500 MG tablet Take 500 mg by mouth daily.   Yes [provider]  busPIRone (BUSPAR) 5 MG tablet Take 5-10 mg by mouth 2 (two) times daily. Take 2 tablets (10mg ) in the morning and take 1 tablet (5mg ) at bedtime   Yes [provider]  cholecalciferol (VITAMIN D) 1000 UNITS tablet Take 1,000 Units by mouth daily.   Yes [provider]  clotrimazole (MYCELEX) 10 MG troche Take 10 mg by mouth  2 (two) times daily.   Yes [provider]  docusate sodium (COLACE) 100 MG capsule Take 100 mg by mouth 2 (two) times daily.    Yes [provider]  donepezil (ARICEPT) 10 MG tablet Take 10 mg by mouth at bedtime.   Yes [provider]  ferrous sulfate 325 (65 FE) MG tablet Take 325 mg by mouth 2 (two) times daily with a meal.   Yes [provider]  gabapentin (NEURONTIN) 300 MG capsule Take 300 mg by mouth 2 (two) times daily.    Yes  [provider]  hydrALAZINE (APRESOLINE) 50 MG tablet Take 50 mg by mouth 3 (three) times daily.   Yes [provider]  HYDROcodone-acetaminophen (NORCO/VICODIN) 5-325 MG tablet Take 1 tablet by mouth every 6 (six) hours as needed for moderate pain.   Yes [provider]  hydrocortisone (ANUSOL-HC) 2.5 % rectal cream Place 1 application rectally every 12 (twelve) hours as needed for hemorrhoids.   Yes [provider]  ipratropium-albuterol (DUONEB) 0.5-2.5 (3) MG/3ML SOLN Take 3 mLs by nebulization every 4 (four) hours as needed (pneumonia).   Yes [provider]  labetalol (NORMODYNE) 300 MG tablet Take 300 mg by mouth 3 (three) times daily.   Yes [provider]  latanoprost (XALATAN) 0.005 % ophthalmic solution Place 1 drop into both eyes at bedtime.    Yes [provider]  mirtazapine (REMERON) 30 MG tablet Take 30 mg by mouth at bedtime.   Yes [provider]  Nutritional Supplements (ENSURE CLEAR) LIQD Take 237 mLs by mouth 2 (two) times daily.   Yes [provider]  ondansetron (ZOFRAN) 4 MG tablet Take 4 mg by mouth every 6 (six) hours as needed for nausea or vomiting.   Yes [provider]  pantoprazole (PROTONIX) 40 MG tablet Take 1 tablet (40 mg total) by mouth 2 (two) times daily. Patient taking differently: Take 40 mg by mouth daily.  05/18/17  Yes Ladene Artist, MD  polyethylene glycol [facility] / Floria Raveling) packet Take 17 g by mouth daily.   Yes [provider]  Propylene Glycol (SYSTANE BALANCE OP) Place 1 drop into both eyes 2 (two) times daily.    Yes [provider]  saccharomyces boulardii (FLORASTOR) 250 MG capsule Take 250 mg by mouth 2 (two) times daily.   Yes [provider]  senna-docusate (SENOKOT-S) 8.6-50 MG tablet Take 1 tablet by mouth 2 (two) times daily.   Yes [provider]  sodium bicarbonate 650 MG tablet Take 1 tablet (650 mg total) by  mouth 2 (two) times daily. 05/25/17  Yes Regalado, Belkys A, MD  torsemide (DEMADEX) 10 MG tablet Take 10 mg by mouth daily.   Yes [provider]  traMADol (ULTRAM) 50 MG tablet Take 50 mg by mouth 3 (three) times daily.    Yes [provider]  vitamin B-12 (CYANOCOBALAMIN) 500 MCG tablet Take 500 mcg by mouth daily.   Yes [provider]  AMBULATORY NON FORMULARY MEDICATION Place 0.125 mg rectally 3 (three) times daily. Medication Name: nitroglycerin ointment 0.125 mg Patient not taking: Reported on 02/12/2018 09/23/17   Levin Erp, PA  clopidogrel (PLAVIX) 75 MG tablet Take 1 tablet (75 mg total) by mouth daily. 10/08/15   Donne Hazel, MD  oseltamivir (TAMIFLU) 30 MG capsule Take 30 mg by mouth daily.    [provider]    Family History Family History  Problem Relation Age of Onset  .  Lung cancer Brother     Social History Social History   Tobacco Use  . Smoking status: Former Research scientist (life sciences)  . Smokeless tobacco: Never Used  Substance Use Topics  . Alcohol use: No  . Drug use: No     Allergies   Quinine derivatives and Codeine   Review of Systems Review of Systems  Unable to perform ROS: Dementia     Physical Exam Updated Vital Signs BP (!) 171/98   Pulse 86   Temp (!) 97.5 F (36.4 C) (Axillary)   Resp 16   SpO2 97%   Physical Exam  Constitutional: She has a sickly appearance.  HENT:  Head: Normocephalic and atraumatic.  Eyes: Conjunctivae and EOM are normal.  Cardiovascular: Normal rate and regular rhythm.  Pulmonary/Chest: Effort normal and breath sounds normal. No stridor. No respiratory distress.  Abdominal: She exhibits no distension.  Musculoskeletal: She exhibits no edema.       Arms: Neurological: She is alert. She displays atrophy.  Poor hearing, but patient moves all extremities spontaneously, minimally.  She follows commands with delay.  Skin: Skin is warm and dry.  Psychiatric: Cognition and memory  are impaired.  Nursing note and vitals reviewed.    ED Treatments / Results  Labs (all labs ordered are listed, but only abnormal results are displayed) Labs Reviewed  COMPREHENSIVE METABOLIC PANEL - Abnormal; Notable for the following components:      Result Value   BUN 42 (*)    Creatinine, Ser 1.65 (*)    Total Protein 6.3 (*)    Albumin 2.5 (*)    ALT 5 (*)    GFR calc non Af Amer 27 (*)    GFR calc Af Amer 32 (*)    All other components within normal limits  CBC WITH DIFFERENTIAL/PLATELET - Abnormal; Notable for the following components:   RBC 3.09 (*)    Hemoglobin 9.4 (*)    HCT 30.1 (*)    All other components within normal limits  URINALYSIS, COMPLETE (UACMP) WITH MICROSCOPIC - Abnormal; Notable for the following components:   Protein, ur 30 (*)    Squamous Epithelial / LPF 0-5 (*)    All other components within normal limits  CBG MONITORING, ED  I-STAT CG4 LACTIC ACID, ED    EKG EKG Interpretation  Date/Time:  Friday February 12 2018 07:33:00 EDT Ventricular Rate:  82 PR Interval:    QRS Duration: 112 QT Interval:  432 QTC Calculation: 505 R Axis:   -36 Text Interpretation:  Unknown rhythm, irregular rate Prolonged PR interval Borderline IVCD with LAD Posterior infarct, old Anterior infarct, old Nonspecific T abnormalities, lateral leads Prolonged QT interval No significant change since last tracing Abnormal ekg Confirmed by Carmin Muskrat [phone]) on 02/12/2018 8:59:42 AM   Radiology Ct Head Wo Contrast  Result Date: 02/12/2018 CLINICAL DATA:  Altered level of consciousness EXAM: CT HEAD WITHOUT CONTRAST TECHNIQUE: Contiguous axial images were obtained from the base of the skull through the vertex without intravenous contrast. COMPARISON:  05/22/2017 FINDINGS: Brain: There is atrophy and chronic small vessel disease changes. No acute intracranial abnormality. Specifically, no hemorrhage, hydrocephalus, mass lesion, acute infarction, or significant intracranial  injury. Vascular: No hyperdense vessel or unexpected calcification. Skull: No acute calvarial abnormality. Sinuses/Orbits: Visualized paranasal sinuses and mastoids clear. Orbital soft tissues unremarkable. Other: None IMPRESSION: No acute intracranial abnormality. Atrophy, chronic microvascular disease. Electronically Signed   By: Rolm Baptise M.D.   On: 02/12/2018 09:18   Dg Chest Port 1  View  Result Date: 02/12/2018 CLINICAL DATA:  Altered mental status EXAM: PORTABLE CHEST 1 VIEW COMPARISON:  May 24, 2017 FINDINGS: There is a small right pleural effusion. There is no edema or consolidation. There is bibasilar atelectasis. There is mild cardiomegaly with pulmonary vascularity normal. Aorta is tortuous with aortic atherosclerosis. No adenopathy. There is a central catheter with tip at the cavoatrial junction. No pneumothorax. There is erosion of both distal clavicles. There is marked superior migration of the humeral heads to the levels of the eroded distal clavicles, stable. There is calcification in both carotid arteries. IMPRESSION: Small right pleural effusion.  Bibasilar atelectasis. Stable cardiac prominence. Tortuous aorta with aortic atherosclerosis. Central catheter tip at cavoatrial junction. Chronic shoulder dislocations with superior migration of both humeral heads and erosion of both distal clavicles. Chronic rotator cuff tears inferred by these findings. There is calcification in both carotid arteries. Aortic Atherosclerosis (ICD10-I70.0). Electronically Signed   By: Lowella Grip III M.D.   On: 02/12/2018 07:36    Procedures Procedures (including critical care time)  Medications Ordered in ED Medications  sodium chloride 0.9 % bolus 1,000 mL (0 mLs Intravenous Stopped 02/12/18 0849)    And  0.9 %  sodium chloride infusion ( Intravenous New Bag/Given 02/12/18 0748)  traMADol (ULTRAM) tablet 50 mg (has no administration in time range)     Initial Impression / Assessment and Plan  / ED Course  I have reviewed the triage vital signs and the nursing notes.  Pertinent labs & imaging results that were available during my care of the patient were reviewed by me and considered in my medical decision making (see chart for details).     12:09 PM On repeat exam the patient is in similar condition awake, alert, speaking clearly. I discussed all findings with the patient's son. Nursing home report was that the patient has had increased amounts of tramadol  recently. Even this, there is some suspicion to that medication contributing to her decreased interactivity given the reassuring findings, no evidence for pneumonia, bacteremia, sepsis, urinary tract infection.  1:21 PM Patient had an episode of seizure. Early in her hospital course she had had occasional nonsustained twitching, but no focal seizure activity. She has no seizure history on chart review. Today's evaluation had reassuring head CT, and with no seizure history, but with acknowledgment of increased tramadol dosing at the nursing facility, there is some suspicion for iatrogenic concretion to her neurologic abnormalities. Patient is recovered from the seizure episode, after receiving half milligram of Ativan.  I discussed the patient's case with our neurology colleagues. Patient will be loaded on Keppra, be admitted for monitoring and management.  No additional seizure activity after the patient received initial Ativan, fluids, Keppra.  Suspicion for increased tramadol dosing contributing to the patient's seizure activity   Final Clinical Impressions(s) / ED Diagnoses  Altered mental status Seizure   Carmin Muskrat, MD 02/12/18 [phone]

## 2018-02-13 ENCOUNTER — Other Ambulatory Visit: Payer: Self-pay

## 2018-02-13 DIAGNOSIS — I160 Hypertensive urgency: Secondary | ICD-10-CM

## 2018-02-13 DIAGNOSIS — R569 Unspecified convulsions: Principal | ICD-10-CM

## 2018-02-13 DIAGNOSIS — N183 Chronic kidney disease, stage 3 (moderate): Secondary | ICD-10-CM

## 2018-02-13 LAB — COMPREHENSIVE METABOLIC PANEL
ALT: 6 U/L — AB (ref 14–54)
AST: 11 U/L — AB (ref 15–41)
Albumin: 2.2 g/dL — ABNORMAL LOW (ref 3.5–5.0)
Alkaline Phosphatase: 56 U/L (ref 38–126)
Anion gap: 9 (ref 5–15)
BILIRUBIN TOTAL: 0.4 mg/dL (ref 0.3–1.2)
BUN: 35 mg/dL — AB (ref 6–20)
CALCIUM: 8.8 mg/dL — AB (ref 8.9–10.3)
CHLORIDE: 113 mmol/L — AB (ref 101–111)
CO2: 22 mmol/L (ref 22–32)
CREATININE: 1.28 mg/dL — AB (ref 0.44–1.00)
GFR calc Af Amer: 43 mL/min — ABNORMAL LOW (ref >60)
GFR, EST NON AFRICAN AMERICAN: 37 mL/min — AB (ref >60)
Glucose, Bld: 84 mg/dL (ref 65–99)
Potassium: 4.5 mmol/L (ref 3.5–5.1)
Sodium: 144 mmol/L (ref 135–145)
TOTAL PROTEIN: 5.9 g/dL — AB (ref 6.5–8.1)

## 2018-02-13 LAB — CBC
HCT: 26.6 % — ABNORMAL LOW (ref 36.0–46.0)
Hemoglobin: 8.6 g/dL — ABNORMAL LOW (ref 12.0–15.0)
MCH: 31.4 pg (ref 26.0–34.0)
MCHC: 32.3 g/dL (ref 30.0–36.0)
MCV: 97.1 fL (ref 78.0–100.0)
PLATELETS: 240 10*3/uL (ref 150–400)
RBC: 2.74 MIL/uL — AB (ref 3.87–5.11)
RDW: 15 % (ref 11.5–15.5)
WBC: 4.3 10*3/uL (ref 4.0–10.5)

## 2018-02-13 MED ORDER — LABETALOL HCL 5 MG/ML IV SOLN
10.0000 mg | Freq: Once | INTRAVENOUS | Status: AC
  Administered 2018-02-13: 10 mg via INTRAVENOUS
  Filled 2018-02-13: qty 4

## 2018-02-13 MED ORDER — HYDRALAZINE HCL 50 MG PO TABS
100.0000 mg | ORAL_TABLET | Freq: Three times a day (TID) | ORAL | Status: DC
  Administered 2018-02-13 – 2018-02-15 (×7): 100 mg via ORAL
  Filled 2018-02-13 (×7): qty 2

## 2018-02-13 MED ORDER — JUVEN PO PACK
1.0000 | PACK | Freq: Two times a day (BID) | ORAL | Status: DC
  Administered 2018-02-13 – 2018-02-15 (×3): 1 via ORAL
  Filled 2018-02-13 (×5): qty 1

## 2018-02-13 MED ORDER — SODIUM CHLORIDE 0.9% FLUSH
10.0000 mL | INTRAVENOUS | Status: DC | PRN
  Administered 2018-02-15: 10 mL
  Filled 2018-02-13: qty 40

## 2018-02-13 MED ORDER — BOOST / RESOURCE BREEZE PO LIQD CUSTOM
1.0000 | Freq: Three times a day (TID) | ORAL | Status: DC
  Administered 2018-02-13 – 2018-02-15 (×4): 1 via ORAL

## 2018-02-13 NOTE — Plan of Care (Signed)
  Problem: Pain Managment: Goal: General experience of comfort will improve Outcome: Progressing   Problem: Safety: Goal: Ability to remain free from injury will improve Outcome: Progressing   Problem: Skin Integrity: Goal: Risk for impaired skin integrity will decrease Outcome: Progressing   

## 2018-02-13 NOTE — Progress Notes (Signed)
BP 193/87 at 0550. PRN IV hydralazine 10mg  given at 0603. BP rechecked and was 182/78. On call provider made aware. Will continue to monitor.

## 2018-02-13 NOTE — Progress Notes (Signed)
Initial Nutrition Assessment  DOCUMENTATION CODES:   Non-severe (moderate) malnutrition in context of chronic illness  INTERVENTION:   -Provide Boost Breeze po TID, each supplement provides 250 kcal and 9 grams of protein -Provide Juven Fruit Punch BID, each serving provides 80kcal and 14g of protein (amino acids glutamine and arginine)  NUTRITION DIAGNOSIS:   Moderate Malnutrition related to dysphagia, chronic illness(dementia) as evidenced by moderate fat depletion, moderate muscle depletion.  GOAL:   Patient will meet greater than or equal to 90% of their needs  MONITOR:   PO intake, Supplement acceptance, Weight trends, Labs, Skin, I & O's  REASON FOR ASSESSMENT:   Malnutrition Screening Tool    ASSESSMENT:   84 y.o. female with medical history significant for chronic depression/anxiety, hypertension, PVD status post right above-the-knee amputation, CHF, GERD, dysphagia, GI bleed who presented to the [facility] ED from SNF with altered mental status.  Patient unable to provide any history given dementia. Pt with history of dysphagia, was placed on dysphagia 1 diet. Per chart review, pt has been drinking Ensure Clear supplements. Will order Boost Breeze, a comparable supplement. Will order Juven supplements given sacral pressure injury.   Per chart review, no weight loss noted.  Medications: Vitamin D tablet daily, Ferrous sulfate tablet BID Labs reviewed: GFR: 43   NUTRITION - FOCUSED PHYSICAL EXAM:    Most Recent Value  Orbital Region  Mild depletion  Upper Arm Region  Moderate depletion  Thoracic and Lumbar Region  Unable to assess  Buccal Region  Mild depletion  Temple Region  Mild depletion  Clavicle Bone Region  Moderate depletion  Clavicle and Acromion Bone Region  Moderate depletion  Scapular Bone Region  Unable to assess  Dorsal Hand  Moderate depletion  Patellar Region  Unable to assess  Anterior Thigh Region  Unable to assess  Posterior Calf Region  Unable  to assess  Edema (RD Assessment)  None       Diet Order:  DIET - DYS 1 Room service appropriate? Yes; Fluid consistency: Thin  EDUCATION NEEDS:   No education needs have been identified at this time  Skin:  Skin Assessment: Skin Integrity Issues: Skin Integrity Issues:: Stage III Stage III: sacrum  Last BM:  4/20  Height:   Ht Readings from Last 1 Encounters:  02/12/18 5\' 2"  (1.575 m)    Weight:   Wt Readings from Last 1 Encounters:  02/12/18 107 lb 4.8 oz (48.7 kg)    Ideal Body Weight:  45 kg(adjusted for rt AKA)  BMI:  Body mass index is 19.63 kg/m.  Estimated Nutritional Needs:   Kcal:  1400-1600  Protein:  65-75g  Fluid:  1.6L/day   Clayton Bibles, MS, RD, LDN [place] Dietitian Pager: [phone] After Hours Pager: [phone]

## 2018-02-13 NOTE — Progress Notes (Signed)
PROGRESS NOTE [facility]   Elizabeth Daniels   EUM:[id number] DOB: 11/11/1930  DOA: 02/12/2018 PCP: Garwin Brothers, MD   Brief Narrative:  Elizabeth Daniels 84 year old female with medical history significant for hypertension, CHF, right AKA, depression/anxiety, dementia, dysphagia and peripheral vascular disease who presented to the emergency department with altered mental status.  She was sent from her SNF after she was found to be somnolent and more confused from baseline.  Upon ED evaluation patient was found to have accelerated hypertension.  While on the ED patient had a seizure episode.  She was admitted with working diagnosis of hypertensive urgency complicated by new onset seizure.   Subjective: Patient seen and examined she is only complaining of mild weakness and constipation.  Otherwise no other concerns.  Daughter is at bedside which reported she is mentally back to almost baseline.  No more  seizure activity.   Assessment & Plan: Acute metabolic encephalopathy secondary to new onset seizure and underlying dementia She was likely postictal. Treat underlying causes  New onset seizure Suspect etiology tramadol, CT head with no acute abnormality, UA negative, chest x-ray with no acute findings.  Case was discussed with neurology who recommended Keppra, will continue 500 mg twice daily.  She will need neurological evaluation as an outpatient.  No need for EEG  as inpatient. Discontinue tramadol indefinitely.  Continue seizure precautions.  Continue to monitor  Hypertensive urgency BP elevated since admission Her home medications were resumed including hydralazine and labetalol.  Will increase hydralazine 200 mg 3 times daily.  Discontinue IV fluids may be contributing to volume expansion.  If BP does not improve we will add amlodipine.  Monitor BP closely.  CKD stage III Creatinine at baseline Avoid nephrotoxic agent Check BMP in a.m.  Anemia of chronic  disease Hemoglobin 9.4 No signs of overt bleeding Monitor CBC  Anxiety/depression Continue BuSpar, Remeron and gabapentin  Dementia Stable mentally at baseline Continue Aricept  Constipation Bowel regimen, will add MiraLAX Tap water enema  DVT prophylaxis: Heparin  SQ Code Status: DNR Family Communication: Daughter at bedside Disposition Plan:  SNF in a.m. if clinically improved  Consultants:   None  Procedures:   None  Antimicrobials:  None   Objective: Vitals:   02/13/18 0636 02/13/18 0858 02/13/18 1107 02/13/18 1352  BP: (!) 182/78 (!) 178/77 (!) 178/96 (!) 187/83  Pulse: 77 68 89 76  Resp:  14    Temp:      TempSrc:      SpO2:  98%    Weight:      Height:        Intake/Output Summary (Last 24 hours) at 02/13/2018 1559 Last data filed at 02/13/2018 1000 Gross per 24 hour  Intake 1625 ml  Output -  Net 1625 ml   Filed Weights   02/12/18 1827  Weight: 48.7 kg (107 lb 4.8 oz)    Examination:  General exam: Appears calm and comfortable, frail elderly woman Respiratory system: Clear to auscultation. No wheezes,crackle or rhonchi Cardiovascular system: S1 & S2 heard, RRR. No JVD, murmurs, rubs or gallops Gastrointestinal system: Abdomen is nondistended, soft and nontender.  Central nervous system: Alert, oriented to person, pleasantly confused, nonfocal Extremities: Right AKA, no edema Skin: No rashes, lesions or ulcers Psychiatry: Mood & affect appropriate.    Data Reviewed: I have personally reviewed following labs and imaging studies  CBC: Recent Labs  Lab 02/12/18 0746 02/13/18 0423  WBC 4.8 4.3  NEUTROABS 3.5  --  HGB 9.4* 8.6*  HCT 30.1* 26.6*  MCV 97.4 97.1  PLT 234 354   Basic Metabolic Panel: Recent Labs  Lab 02/12/18 0746 02/13/18 0423  NA 142 144  K 4.8 4.5  CL 110 113*  CO2 25 22  GLUCOSE 93 84  BUN 42* 35*  CREATININE 1.65* 1.28*  CALCIUM 8.9 8.8*   GFR: Estimated Creatinine Clearance: 25.2 mL/min (A) (by C-G  formula based on SCr of 1.28 mg/dL (H)). Liver Function Tests: Recent Labs  Lab 02/12/18 0746 02/13/18 0423  AST 15 11*  ALT 5* 6*  ALKPHOS 58 56  BILITOT 0.3 0.4  PROT 6.3* 5.9*  ALBUMIN 2.5* 2.2*   No results for input(s): LIPASE, AMYLASE in the last 168 hours. No results for input(s): AMMONIA in the last 168 hours. Coagulation Profile: No results for input(s): INR, PROTIME in the last 168 hours. Cardiac Enzymes: No results for input(s): CKTOTAL, CKMB, CKMBINDEX, TROPONINI in the last 168 hours. BNP (last 3 results) No results for input(s): PROBNP in the last 8760 hours. HbA1C: No results for input(s): HGBA1C in the last 72 hours. CBG: Recent Labs  Lab 02/12/18 0734 02/12/18 1249  GLUCAP 92 96   Lipid Profile: No results for input(s): CHOL, HDL, LDLCALC, TRIG, CHOLHDL, LDLDIRECT in the last 72 hours. Thyroid Function Tests: No results for input(s): TSH, T4TOTAL, FREET4, T3FREE, THYROIDAB in the last 72 hours. Anemia Panel: No results for input(s): VITAMINB12, FOLATE, FERRITIN, TIBC, IRON, RETICCTPCT in the last 72 hours. Sepsis Labs: Recent Labs  Lab 02/12/18 0754  LATICACIDVEN 1.22    Recent Results (from the past 240 hour(s))  MRSA PCR Screening     Status: None   Collection Time: 02/12/18  7:06 PM  Result Value Ref Range Status   MRSA by PCR NEGATIVE NEGATIVE Final    Comment:        The GeneXpert MRSA Assay (FDA approved for NASAL specimens only), is one component of a comprehensive MRSA colonization surveillance program. It is not intended to diagnose MRSA infection nor to guide or monitor treatment for MRSA infections. Performed at [facility], [place] [address] [place], [place] [address]       Radiology Studies: Ct Head Wo Contrast  Result Date: 02/12/2018 CLINICAL DATA:  Altered level of consciousness EXAM: CT HEAD WITHOUT CONTRAST TECHNIQUE: Contiguous axial images were obtained from the base of the skull through the  vertex without intravenous contrast. COMPARISON:  05/22/2017 FINDINGS: Brain: There is atrophy and chronic small vessel disease changes. No acute intracranial abnormality. Specifically, no hemorrhage, hydrocephalus, mass lesion, acute infarction, or significant intracranial injury. Vascular: No hyperdense vessel or unexpected calcification. Skull: No acute calvarial abnormality. Sinuses/Orbits: Visualized paranasal sinuses and mastoids clear. Orbital soft tissues unremarkable. Other: None IMPRESSION: No acute intracranial abnormality. Atrophy, chronic microvascular disease. Electronically Signed   By: Rolm Baptise M.D.   On: 02/12/2018 09:18   Dg Chest Port 1 View  Result Date: 02/12/2018 CLINICAL DATA:  Altered mental status EXAM: PORTABLE CHEST 1 VIEW COMPARISON:  May 24, 2017 FINDINGS: There is a small right pleural effusion. There is no edema or consolidation. There is bibasilar atelectasis. There is mild cardiomegaly with pulmonary vascularity normal. Aorta is tortuous with aortic atherosclerosis. No adenopathy. There is a central catheter with tip at the cavoatrial junction. No pneumothorax. There is erosion of both distal clavicles. There is marked superior migration of the humeral heads to the levels of the eroded distal clavicles, stable. There is calcification in both  carotid arteries. IMPRESSION: Small right pleural effusion.  Bibasilar atelectasis. Stable cardiac prominence. Tortuous aorta with aortic atherosclerosis. Central catheter tip at cavoatrial junction. Chronic shoulder dislocations with superior migration of both humeral heads and erosion of both distal clavicles. Chronic rotator cuff tears inferred by these findings. There is calcification in both carotid arteries. Aortic Atherosclerosis (ICD10-I70.0). Electronically Signed   By: Lowella Grip III M.D.   On: 02/12/2018 07:36      Scheduled Meds: . acetaminophen  1,000 mg Oral Once  . allopurinol  50 mg Oral Daily  . busPIRone   10 mg Oral Daily  . busPIRone  5 mg Oral QHS  . cholecalciferol  1,000 Units Oral Daily  . clopidogrel  75 mg Oral Daily  . donepezil  10 mg Oral QHS  . feeding supplement  1 Container Oral TID WC  . ferrous sulfate  325 mg Oral BID WC  . gabapentin  300 mg Oral BID  . heparin injection (subcutaneous)  5,000 Units Subcutaneous Q8H  . hydrALAZINE  100 mg Oral TID  . labetalol  300 mg Oral TID  .  morphine injection  2 mg Intravenous Once  . nutrition supplement (JUVEN)  1 packet Oral BID BM  . pantoprazole  40 mg Oral Daily  . senna-docusate  1 tablet Oral BID  . sodium bicarbonate  650 mg Oral BID  . sodium phosphate  1 enema Rectal Once   Continuous Infusions: . levETIRAcetam Stopped (02/13/18 0306)     LOS: 1 day    Time spent: Total of 25 minutes spent with pt, greater than 50% of which was spent in discussion of  treatment, counseling and coordination of care   Chipper Oman, MD Pager: Text Page via [email or web address]   If 7PM-7AM, please contact night-coverage [email or web address] 02/13/2018, 3:59 PM   Note - This record has been created using [employer]. Chart creation errors have been sought, but may not always have been located. Such creation errors do not reflect on the standard of medical care.

## 2018-02-14 ENCOUNTER — Inpatient Hospital Stay (HOSPITAL_COMMUNITY): Payer: Medicare Other

## 2018-02-14 LAB — BASIC METABOLIC PANEL
ANION GAP: 11 (ref 5–15)
BUN: 32 mg/dL — AB (ref 6–20)
CO2: 20 mmol/L — ABNORMAL LOW (ref 22–32)
Calcium: 9 mg/dL (ref 8.9–10.3)
Chloride: 114 mmol/L — ABNORMAL HIGH (ref 101–111)
Creatinine, Ser: 1.27 mg/dL — ABNORMAL HIGH (ref 0.44–1.00)
GFR calc Af Amer: 44 mL/min — ABNORMAL LOW (ref >60)
GFR, EST NON AFRICAN AMERICAN: 38 mL/min — AB (ref >60)
Glucose, Bld: 96 mg/dL (ref 65–99)
POTASSIUM: 4.3 mmol/L (ref 3.5–5.1)
SODIUM: 145 mmol/L (ref 135–145)

## 2018-02-14 LAB — MAGNESIUM: MAGNESIUM: 1.9 mg/dL (ref 1.7–2.4)

## 2018-02-14 MED ORDER — RISPERIDONE 0.5 MG PO TABS
0.5000 mg | ORAL_TABLET | Freq: Two times a day (BID) | ORAL | Status: DC
  Administered 2018-02-14 – 2018-02-15 (×2): 0.5 mg via ORAL
  Filled 2018-02-14 (×2): qty 1

## 2018-02-14 MED ORDER — HALOPERIDOL LACTATE 5 MG/ML IJ SOLN
2.0000 mg | Freq: Once | INTRAMUSCULAR | Status: AC
  Administered 2018-02-14: 2 mg via INTRAVENOUS
  Filled 2018-02-14: qty 1

## 2018-02-14 MED ORDER — HALOPERIDOL LACTATE 5 MG/ML IJ SOLN
1.0000 mg | Freq: Four times a day (QID) | INTRAMUSCULAR | Status: DC | PRN
  Administered 2018-02-14: 1 mg via INTRAVENOUS
  Filled 2018-02-14 (×2): qty 1

## 2018-02-14 MED ORDER — CLONIDINE HCL 0.2 MG PO TABS
0.2000 mg | ORAL_TABLET | Freq: Once | ORAL | Status: AC
  Administered 2018-02-14: 0.2 mg via ORAL
  Filled 2018-02-14: qty 1

## 2018-02-14 MED ORDER — POLYETHYLENE GLYCOL 3350 17 G PO PACK
17.0000 g | PACK | Freq: Every day | ORAL | Status: DC
  Administered 2018-02-15: 17 g via ORAL

## 2018-02-14 MED ORDER — LABETALOL HCL 5 MG/ML IV SOLN
10.0000 mg | Freq: Once | INTRAVENOUS | Status: AC
  Administered 2018-02-14: 10 mg via INTRAVENOUS
  Filled 2018-02-14: qty 4

## 2018-02-14 MED ORDER — AMLODIPINE BESYLATE 10 MG PO TABS
10.0000 mg | ORAL_TABLET | Freq: Every day | ORAL | Status: DC
  Administered 2018-02-15: 10 mg via ORAL
  Filled 2018-02-14 (×2): qty 1

## 2018-02-14 MED ORDER — HYDRALAZINE HCL 20 MG/ML IJ SOLN
10.0000 mg | Freq: Four times a day (QID) | INTRAMUSCULAR | Status: DC | PRN
  Administered 2018-02-14: 10 mg via INTRAVENOUS
  Filled 2018-02-14 (×2): qty 1

## 2018-02-14 MED ORDER — LABETALOL HCL 5 MG/ML IV SOLN
10.0000 mg | INTRAVENOUS | Status: DC | PRN
  Administered 2018-02-14: 10 mg via INTRAVENOUS
  Filled 2018-02-14: qty 4

## 2018-02-14 MED ORDER — CLONIDINE HCL 0.2 MG/24HR TD PTWK
0.2000 mg | MEDICATED_PATCH | TRANSDERMAL | Status: DC
  Administered 2018-02-14: 0.2 mg via TRANSDERMAL
  Filled 2018-02-14: qty 1

## 2018-02-14 NOTE — Plan of Care (Signed)
  Problem: Elimination: Goal: Will not experience complications related to bowel motility Outcome: Progressing Goal: Will not experience complications related to urinary retention Outcome: Progressing   Problem: Pain Managment: Goal: General experience of comfort will improve Outcome: Progressing   Problem: Safety: Goal: Ability to remain free from injury will improve Outcome: Progressing   

## 2018-02-14 NOTE — Progress Notes (Addendum)
PROGRESS NOTE [facility]   Elizabeth Daniels   TIW:[id number] DOB: July 31, 1931  DOA: 02/12/2018 PCP: Elizabeth Brothers, MD   Brief Narrative:  Elizabeth Daniels 84 year old female with medical history significant for hypertension, CHF, right AKA, depression/anxiety, dementia, dysphagia and peripheral vascular disease who presented to the emergency department with altered mental status.  She was sent from her SNF after she was found to be somnolent and more confused from baseline.  Upon ED evaluation patient was found to have accelerated hypertension.  While on the ED patient had a seizure episode.  She was admitted with working diagnosis of hypertensive urgency complicated by new onset seizure.   Subjective: Patient seen and examined, blood pressure has been elevated persistently.  Patient was agitated, sundowning.  Patient remains confused.  Daughter at bedside report mentally close to baseline  Assessment & Plan: Acute metabolic encephalopathy secondary to new onset seizure and underlying dementia She was likely postictal. Treat underlying causes  Hospital-acquired delirium Multifactorial, hypertension, seizures, constipation, pain and dementia Will add risperidone 2.5 mg twice daily Keep room with good light, encourage family to stay with patient as long as possible  New onset seizure Suspect etiology tramadol, CT head with no acute abnormality, UA negative, chest x-ray with no acute findings.  Case was discussed with neurology who recommended Keppra, will continue 500 mg twice daily.  She will need neurological evaluation as an outpatient.  No need for EEG  as inpatient. Discontinue tramadol indefinitely.  Continue seizure precautions.  Continue to monitor  Hypertensive urgency Blood pressure remains elevated, patient agitated every time that  they attempt to check blood pressure.  I have check blood pressure mildly myself and it was 180/90.  Patient on hydralazine 100 mg 3 times  daily, labetalol 200 mg 3 times daily.  Will add amlodipine 10 mg daily.  Monitor BP closely.   Addendum: Patient refusing PO meds, will place clonidine patch to see if BP improve, continue hydralazine IV PRN for SBP > 170 and Labetalol IV PRN for SBP > 180  AKI on CKD stage III Creatinine of admission 1.6 Creatinine improved with IV fluid Avoid nephrotoxic agent Monitor BMP in a.m.  Anemia of chronic disease Hemoglobin stable No signs of overt bleeding Monitor CBC  Anxiety/depression Continue BuSpar, Remeron and gabapentin  Dementia Having sundowning and agitation Can use Haldol if agitated, risperidone has been added.  Avoid benzos Continue Aricept  Constipation Bowel regimen, will add MiraLAX Tap water enema  DVT prophylaxis: Heparin  SQ Code Status: DNR Family Communication: Daughter at bedside Disposition Plan:  SNF in a.m. if clinically improved  Consultants:   None  Procedures:   None  Antimicrobials:  None   Objective: Vitals:   02/14/18 1209 02/14/18 1217 02/14/18 1310 02/14/18 1400  BP: (!) 163/116 (!) 180/90 (!) 193/94 (!) 190/79  Pulse: 93  92   Resp:   16   Temp:   99 F (37.2 C)   TempSrc:   Oral   SpO2:   96%   Weight:      Height:        Intake/Output Summary (Last 24 hours) at 02/14/2018 1447 Last data filed at 02/14/2018 1000 Gross per 24 hour  Intake 370 ml  Output -  Net 370 ml   Filed Weights   02/12/18 1827  Weight: 48.7 kg (107 lb 4.8 oz)    Examination:  General: NAD Cardiovascular: RRR, S1/S2 +, no rubs, no gallops Respiratory: CTA bilaterally, no wheezing, no rhonchi  Abdominal: Soft, NT, ND, bowel sounds + Extremities: R AKA, no edema   Data Reviewed: I have personally reviewed following labs and imaging studies  CBC: Recent Labs  Lab 02/12/18 0746 02/13/18 0423  WBC 4.8 4.3  NEUTROABS 3.5  --   HGB 9.4* 8.6*  HCT 30.1* 26.6*  MCV 97.4 97.1  PLT 234 563   Basic Metabolic Panel: Recent Labs  Lab  02/12/18 0746 02/13/18 0423 02/14/18 0348  NA 142 144 145  K 4.8 4.5 4.3  CL 110 113* 114*  CO2 25 22 20*  GLUCOSE 93 84 96  BUN 42* 35* 32*  CREATININE 1.65* 1.28* 1.27*  CALCIUM 8.9 8.8* 9.0  MG  --   --  1.9   GFR: Estimated Creatinine Clearance: 25.4 mL/min (A) (by C-G formula based on SCr of 1.27 mg/dL (H)). Liver Function Tests: Recent Labs  Lab 02/12/18 0746 02/13/18 0423  AST 15 11*  ALT 5* 6*  ALKPHOS 58 56  BILITOT 0.3 0.4  PROT 6.3* 5.9*  ALBUMIN 2.5* 2.2*   No results for input(s): LIPASE, AMYLASE in the last 168 hours. No results for input(s): AMMONIA in the last 168 hours. Coagulation Profile: No results for input(s): INR, PROTIME in the last 168 hours. Cardiac Enzymes: No results for input(s): CKTOTAL, CKMB, CKMBINDEX, TROPONINI in the last 168 hours. BNP (last 3 results) No results for input(s): PROBNP in the last 8760 hours. HbA1C: No results for input(s): HGBA1C in the last 72 hours. CBG: Recent Labs  Lab 02/12/18 0734 02/12/18 1249  GLUCAP 92 96   Lipid Profile: No results for input(s): CHOL, HDL, LDLCALC, TRIG, CHOLHDL, LDLDIRECT in the last 72 hours. Thyroid Function Tests: No results for input(s): TSH, T4TOTAL, FREET4, T3FREE, THYROIDAB in the last 72 hours. Anemia Panel: No results for input(s): VITAMINB12, FOLATE, FERRITIN, TIBC, IRON, RETICCTPCT in the last 72 hours. Sepsis Labs: Recent Labs  Lab 02/12/18 0754  LATICACIDVEN 1.22    Recent Results (from the past 240 hour(s))  MRSA PCR Screening     Status: None   Collection Time: 02/12/18  7:06 PM  Result Value Ref Range Status   MRSA by PCR NEGATIVE NEGATIVE Final    Comment:        The GeneXpert MRSA Assay (FDA approved for NASAL specimens only), is one component of a comprehensive MRSA colonization surveillance program. It is not intended to diagnose MRSA infection nor to guide or monitor treatment for MRSA infections. Performed at [facility], [place] [address], [place], [place] [address]       Radiology Studies: Dg Sacrum/coccyx  Result Date: 02/14/2018 CLINICAL DATA:  Sacral and coccygeal pain.  No injury. EXAM: SACRUM AND COCCYX - 2+ VIEW COMPARISON:  CT abdomen and pelvis 09/30/2015. FINDINGS: There is no evidence of fracture or other focal bone lesions. IMPRESSION: Negative exam. Electronically Signed   By: Inge Rise M.D.   On: 02/14/2018 14:19      Scheduled Meds: . acetaminophen  1,000 mg Oral Once  . allopurinol  50 mg Oral Daily  . amLODipine  10 mg Oral Daily  . busPIRone  10 mg Oral Daily  . busPIRone  5 mg Oral QHS  . cholecalciferol  1,000 Units Oral Daily  . clopidogrel  75 mg Oral Daily  . donepezil  10 mg Oral QHS  . feeding supplement  1 Container Oral TID WC  . ferrous sulfate  325 mg Oral BID WC  . gabapentin  300 mg  Oral BID  . heparin injection (subcutaneous)  5,000 Units Subcutaneous Q8H  . hydrALAZINE  100 mg Oral TID  . labetalol  300 mg Oral TID  . nutrition supplement (JUVEN)  1 packet Oral BID BM  . pantoprazole  40 mg Oral Daily  . risperiDONE  0.5 mg Oral BID  . senna-docusate  1 tablet Oral BID  . sodium bicarbonate  650 mg Oral BID   Continuous Infusions: . levETIRAcetam Stopped (02/14/18 0537)     LOS: 2 days    Time spent: Total of 25 minutes spent with pt, greater than 50% of which was spent in discussion of  treatment, counseling and coordination of care   Chipper Oman, MD Pager: Text Page via [email or web address]   If 7PM-7AM, please contact night-coverage [email or web address] 02/14/2018, 2:47 PM   Note - This record has been created using [employer]. Chart creation errors have been sought, but may not always have been located. Such creation errors do not reflect on the standard of medical care.

## 2018-02-15 DIAGNOSIS — N179 Acute kidney failure, unspecified: Secondary | ICD-10-CM

## 2018-02-15 DIAGNOSIS — I161 Hypertensive emergency: Secondary | ICD-10-CM

## 2018-02-15 LAB — BASIC METABOLIC PANEL
Anion gap: 14 (ref 5–15)
BUN: 34 mg/dL — ABNORMAL HIGH (ref 6–20)
CHLORIDE: 114 mmol/L — AB (ref 101–111)
CO2: 18 mmol/L — AB (ref 22–32)
CREATININE: 1.46 mg/dL — AB (ref 0.44–1.00)
Calcium: 9.2 mg/dL (ref 8.9–10.3)
GFR calc non Af Amer: 32 mL/min — ABNORMAL LOW (ref >60)
GFR, EST AFRICAN AMERICAN: 37 mL/min — AB (ref >60)
Glucose, Bld: 75 mg/dL (ref 65–99)
POTASSIUM: 4.4 mmol/L (ref 3.5–5.1)
SODIUM: 146 mmol/L — AB (ref 135–145)

## 2018-02-15 MED ORDER — LEVETIRACETAM 500 MG PO TABS: 500.0000 mg | ORAL_TABLET | Freq: Two times a day (BID) | ORAL | Status: AC

## 2018-02-15 MED ORDER — HYDRALAZINE HCL 100 MG PO TABS: 100.0000 mg | ORAL_TABLET | Freq: Three times a day (TID) | ORAL | Status: AC

## 2018-02-15 MED ORDER — CLONIDINE 0.2 MG/24HR TD PTWK: 0.2000 mg | MEDICATED_PATCH | TRANSDERMAL | 12 refills | Status: AC

## 2018-02-15 NOTE — Clinical Social Work Note (Signed)
Pt returning to [facility] at DC- report [phone].    Clinical Social Work Assessment  Patient Details  Name: Elizabeth Daniels MRN: [id number] Date of Birth: [birth year]  Date of referral:  02/15/18               Reason for consult:  (pt admitted from facility)                Permission sought to share information with:  Family Supports Permission granted to share information::  Yes, Verbal Permission Granted  Name::     daughter Kenney Houseman  Agency::  [facility]  Relationship::     Contact Information:     Housing/Transportation Living arrangements for the past 2 months:  [place] of Information:  Patient, Adult Children, Facility, Medical Team Patient Interpreter Needed:  None Criminal Activity/Legal Involvement Pertinent to Current Situation/Hospitalization:  No - Comment as needed Significant Relationships:  Adult Children, Warehouse manager Lives with:  Facility Resident Do you feel safe going back to the place where you live?  Yes Need for family participation in patient care:  Yes (Comment)(pt with dementia. AMS)  Care giving concerns:  Pt is long term care resident at [facility]. Has dementia and is generally only oriented to person and sometimes place. Able to recognize family and caregivers. Is total care at [facility]. Has lived there since 2016.  Currently admitted for new onset seizure, acute metabolic encephalopathy.   Social Worker assessment / plan:  CSW consulted to assist with disposition [facility] Met with pt at bedside- she was pleasant but not oriented to situation. Advised CSW speak with daughter Lavella Lemons. Daughter reports family pleased with care at [facility] and plan to return at DC. Spoke with facility as well- they are anticipating pt's return. Completed FL2 and will provide DC information via the [place]. Will arrange [facility] transport when pt ready for DC.   Employment  status:  Retired Engineer, maintenance (IT)) PT Recommendations:  Not assessed at this time Information / Referral to community resources:  [place]  Patient/Family's Response to care:  Daughter expressed appreciation  Patient/Family's Understanding of and Emotional Response to Diagnosis, Current Treatment, and Prognosis:  Pt did not demonstrate understanding- consistent with her baseline dementia. Was emotionally unremarkable- pleasant but fairly flat.   Emotional Assessment Appearance:  Appears stated age Attitude/Demeanor/Rapport:  (confused) Affect (typically observed):  Calm, Pleasant Orientation:  Oriented to Self, Oriented to Place Alcohol / Substance use:  Not Applicable Psych involvement (Current and /or in the community):  No (Comment)  Discharge Needs  Concerns to be addressed:  Care Coordination Readmission within the last 30 days:  No Current discharge risk:  None Barriers to Discharge:  No Barriers Identified   Nila Nephew, LCSW 02/15/2018, 2:55 PM  [phone]

## 2018-02-15 NOTE — Discharge Summary (Signed)
Physician Discharge Summary  Elizabeth Daniels  YTK:[id number]  DOB: 1930/12/09  DOA: 02/12/2018 PCP: Garwin Brothers, MD  Admit date: 02/12/2018 Discharge date: 02/15/2018  Admitted From: SNF  Disposition: SNF   Recommendations for Outpatient Follow-up:  1. Follow up with SNF provider at earliest convenience 2. Neurology follow-up in 3-4 weeks 3. Please obtain BMP/CBC in one week to monitor hemoglobin and renal function  Discharge Condition: Stable CODE STATUS: DNR Diet recommendation: Heart Healthy   Brief/Interim Summary: For full details see H&P/Progress note, but in brief, Elizabeth Daniels is a 84 year old female with medical history significant for hypertension, CHF, right AKA, depression/anxiety, dementia, dysphagia and peripheral vascular disease who presented to the emergency department with altered mental status.  She was sent from her SNF after she was found to be somnolent and more confused from baseline.  Upon ED evaluation patient was found to have accelerated hypertension.  While on the ED patient had a seizure episode.  She was admitted with working diagnosis of hypertensive urgency complicated by new onset seizure.   Subjective: Patient seen and examined, blood pressure much better today.  She is cooperative with nursing staff and oriented to self and place.  No acute events overnight.  Discharge Diagnoses/Hospital Course:  Acute metabolic encephalopathy secondary to new onset seizure and underlying dementia Postictal Treat underlying causes  New onset seizure Due to tramadol, CT head with no acute abnormality, UA negative, chest x-ray with no acute findings. Case was discussed with neurology who recommended Keppra, will continue 500 mg twice daily.  She will need neurological evaluation as an outpatient. No need for EEG as inpatient. Discontinue tramadol indefinitely.   Hospital-acquired delirium Multifactorial, hypertension, seizures, constipation, pain and  dementia Treated with low-dose Haldol, patient responded well.  Hypertensive urgency Blood pressure was difficult to measure this patient was agitated and not cooperating with cuff machine.  Patient was also refusing to take oral medications therefore a clonidine patch was placed for BP control.  She was also treated with IV hydralazine and labetalol.  Will discharge on clonidine patch 0.2 mg weekly, hydralazine 100 mg 3 times daily and labetalol 300 mg every 8 hours.  Follow-up with primary care provider in 1 week for BP monitoring.  AKI on CKD stage III Creatinine improved after IV hydration, stable upon discharge Avoid nephrotoxic agent Monitor BMP in 1 week  Anemia of chronic disease Hemoglobin stable No signs of overt bleeding Monitor CBC  Anxiety/depression Continue BuSpar, Remeron and gabapentin  Dementia Continue Aricept  Constipation Was treated with tap water enema, which improved constipation. Continue bisacodyl and MiraLAX.  All other chronic medical condition were stable during the hospitalization.  On the day of the discharge the patient's vitals were stable, and no other acute medical condition were reported by patient. the patient was felt safe to be discharge to SNF  Discharge Instructions  You were cared for by a hospitalist during your hospital stay. If you have any questions about your discharge medications or the care you received while you were in the hospital after you are discharged, you can call the unit and asked to speak with the hospitalist on call if the hospitalist that took care of you is not available. Once you are discharged, your primary care physician will handle any further medical issues. Please note that NO REFILLS for any discharge medications will be authorized once you are discharged, as it is imperative that you return to your primary care physician (or establish a relationship with a  primary care physician if you do not have one) for your  aftercare needs so that they can reassess your need for medications and monitor your lab values.  Discharge Instructions    Call MD for:  difficulty breathing, headache or visual disturbances   Complete by:  As directed    Call MD for:  extreme fatigue   Complete by:  As directed    Call MD for:  hives   Complete by:  As directed    Call MD for:  persistant dizziness or light-headedness   Complete by:  As directed    Call MD for:  persistant nausea and vomiting   Complete by:  As directed    Call MD for:  redness, tenderness, or signs of infection (pain, swelling, redness, odor or green/yellow discharge around incision site)   Complete by:  As directed    Call MD for:  severe uncontrolled pain   Complete by:  As directed    Call MD for:  temperature >100.4   Complete by:  As directed    Diet - low sodium heart healthy   Complete by:  As directed    Increase activity slowly   Complete by:  As directed      Allergies as of 02/15/2018      Reactions   Quinine Derivatives Rash   MAR   Codeine    MAR      Medication List    STOP taking these medications   AMBULATORY NON FORMULARY MEDICATION   clopidogrel 75 MG tablet Commonly known as:  PLAVIX   oseltamivir 30 MG capsule Commonly known as:  TAMIFLU   saccharomyces boulardii 250 MG capsule Commonly known as:  FLORASTOR   senna-docusate 8.6-50 MG tablet Commonly known as:  Senokot-S   traMADol 50 MG tablet Commonly known as:  ULTRAM     TAKE these medications   acetaminophen 500 MG tablet Commonly known as:  TYLENOL Take 500 mg by mouth every morning.   acetaminophen 325 MG tablet Commonly known as:  TYLENOL Take 650 mg by mouth every 8 (eight) hours as needed for fever.   allopurinol 100 MG tablet Commonly known as:  ZYLOPRIM Take 50 mg by mouth daily.   ascorbic acid 500 MG tablet Commonly known as:  VITAMIN C Take 500 mg by mouth daily.   busPIRone 5 MG tablet Commonly known as:  BUSPAR Take 5-10 mg  by mouth 2 (two) times daily. Take 2 tablets (10mg ) in the morning and take 1 tablet (5mg ) at bedtime   cholecalciferol 1000 units tablet Commonly known as:  VITAMIN D Take 1,000 Units by mouth daily.   cloNIDine 0.2 mg/24hr patch Commonly known as:  CATAPRES - Dosed in mg/24 hr Place 1 patch (0.2 mg total) onto the skin once a week. Start taking on:  02/21/2018   clotrimazole 10 MG troche Commonly known as:  MYCELEX Take 10 mg by mouth 2 (two) times daily.   docusate sodium 100 MG capsule Commonly known as:  COLACE Take 100 mg by mouth 2 (two) times daily.   donepezil 10 MG tablet Commonly known as:  ARICEPT Take 10 mg by mouth at bedtime.   ENSURE CLEAR Liqd Take 237 mLs by mouth 2 (two) times daily.   ferrous sulfate 325 (65 FE) MG tablet Take 325 mg by mouth 2 (two) times daily with a meal.   gabapentin 300 MG capsule Commonly known as:  NEURONTIN Take 300 mg by mouth 2 (two) times  daily.   hydrALAZINE 100 MG tablet Commonly known as:  APRESOLINE Take 1 tablet (100 mg total) by mouth 3 (three) times daily. What changed:    medication strength  how much to take   HYDROcodone-acetaminophen 5-325 MG tablet Commonly known as:  NORCO/VICODIN Take 1 tablet by mouth every 6 (six) hours as needed for moderate pain.   hydrocortisone 2.5 % rectal cream Commonly known as:  ANUSOL-HC Place 1 application rectally every 12 (twelve) hours as needed for hemorrhoids.   ipratropium-albuterol 0.5-2.5 (3) MG/3ML Soln Commonly known as:  DUONEB Take 3 mLs by nebulization every 4 (four) hours as needed (pneumonia).   labetalol 300 MG tablet Commonly known as:  NORMODYNE Take 300 mg by mouth 3 (three) times daily.   latanoprost 0.005 % ophthalmic solution Commonly known as:  XALATAN Place 1 drop into both eyes at bedtime.   levETIRAcetam 500 MG tablet Commonly known as:  KEPPRA Take 1 tablet (500 mg total) by mouth 2 (two) times daily.   mirtazapine 30 MG  tablet Commonly known as:  REMERON Take 30 mg by mouth at bedtime.   MYLANTA 200-200-20 MG/5ML suspension Generic drug:  alum & mag hydroxide-simeth Take 30 mLs by mouth 3 (three) times daily.   ondansetron 4 MG tablet Commonly known as:  ZOFRAN Take 4 mg by mouth every 6 (six) hours as needed for nausea or vomiting.   pantoprazole 40 MG tablet Commonly known as:  PROTONIX Take 1 tablet (40 mg total) by mouth 2 (two) times daily. What changed:  when to take this   polyethylene glycol packet Commonly known as:  MIRALAX / GLYCOLAX Take 17 g by mouth daily.   sodium bicarbonate 650 MG tablet Take 1 tablet (650 mg total) by mouth 2 (two) times daily.   SYSTANE BALANCE OP Place 1 drop into both eyes 2 (two) times daily.   torsemide 10 MG tablet Commonly known as:  DEMADEX Take 10 mg by mouth daily.   vitamin B-12 500 MCG tablet Commonly known as:  CYANOCOBALAMIN Take 500 mcg by mouth daily.       Contact information for follow-up providers    [facility]. Schedule an appointment as soon as possible for a visit in 3 week(s).   Specialty:  Neurology Why:  Establish care, new onset seizure Contact information: [address] [place] [phone]           Contact information for after-discharge care    [facility] .   Service:  Skilled Nursing Contact information: [address] [address] [phone]                 Allergies  Allergen Reactions  . Quinine Derivatives Rash    MAR  . Codeine     MAR    Consultations:   Procedures/Studies: Dg Sacrum/coccyx  Result Date: 02/14/2018 CLINICAL DATA:  Sacral and coccygeal pain.  No injury. EXAM: SACRUM AND COCCYX - 2+ VIEW COMPARISON:  CT abdomen and pelvis 09/30/2015. FINDINGS: There is no evidence of fracture or other focal bone lesions. IMPRESSION: Negative exam. Electronically Signed    By: Inge Rise M.D.   On: 02/14/2018 14:19   Ct Head Wo Contrast  Result Date: 02/12/2018 CLINICAL DATA:  Altered level of consciousness EXAM: CT HEAD WITHOUT CONTRAST TECHNIQUE: Contiguous axial images were obtained from the base of the skull through the vertex without intravenous contrast. COMPARISON:  05/22/2017  FINDINGS: Brain: There is atrophy and chronic small vessel disease changes. No acute intracranial abnormality. Specifically, no hemorrhage, hydrocephalus, mass lesion, acute infarction, or significant intracranial injury. Vascular: No hyperdense vessel or unexpected calcification. Skull: No acute calvarial abnormality. Sinuses/Orbits: Visualized paranasal sinuses and mastoids clear. Orbital soft tissues unremarkable. Other: None IMPRESSION: No acute intracranial abnormality. Atrophy, chronic microvascular disease. Electronically Signed   By: Rolm Baptise M.D.   On: 02/12/2018 09:18   Dg Chest Port 1 View  Result Date: 02/12/2018 CLINICAL DATA:  Altered mental status EXAM: PORTABLE CHEST 1 VIEW COMPARISON:  May 24, 2017 FINDINGS: There is a small right pleural effusion. There is no edema or consolidation. There is bibasilar atelectasis. There is mild cardiomegaly with pulmonary vascularity normal. Aorta is tortuous with aortic atherosclerosis. No adenopathy. There is a central catheter with tip at the cavoatrial junction. No pneumothorax. There is erosion of both distal clavicles. There is marked superior migration of the humeral heads to the levels of the eroded distal clavicles, stable. There is calcification in both carotid arteries. IMPRESSION: Small right pleural effusion.  Bibasilar atelectasis. Stable cardiac prominence. Tortuous aorta with aortic atherosclerosis. Central catheter tip at cavoatrial junction. Chronic shoulder dislocations with superior migration of both humeral heads and erosion of both distal clavicles. Chronic rotator cuff tears inferred by these findings. There is  calcification in both carotid arteries. Aortic Atherosclerosis (ICD10-I70.0). Electronically Signed   By: Lowella Grip III M.D.   On: 02/12/2018 07:36     Discharge Exam: Vitals:   02/15/18 0504 02/15/18 0900  BP: (!) 155/67 (!) (P) 162/67  Pulse: 86   Resp: 16   Temp: 98.5 F (36.9 C)   SpO2: 98%    Vitals:   02/14/18 1839 02/14/18 2213 02/15/18 0504 02/15/18 0900  BP: (!) 161/72 (!) 152/70 (!) 155/67 (!) (P) 162/67  Pulse:  (!) 102 86   Resp:  18 16   Temp:  99.1 F (37.3 C) 98.5 F (36.9 C)   TempSrc:  Oral Oral   SpO2:  97% 98%   Weight:      Height:        General: NAD Cardiovascular: RRR, S1/S2 +, no rubs, no gallops Respiratory: CTA bilaterally, no wheezing, no rhonchi Abdominal: Soft, NT, ND Extremities: Right AKA Neuro: Pleasantly confused oriented to person and place only  The results of significant diagnostics from this hospitalization (including imaging, microbiology, ancillary and laboratory) are listed below for reference.     Microbiology: Recent Results (from the past 240 hour(s))  MRSA PCR Screening     Status: None   Collection Time: 02/12/18  7:06 PM  Result Value Ref Range Status   MRSA by PCR NEGATIVE NEGATIVE Final    Comment:        The GeneXpert MRSA Assay (FDA approved for NASAL specimens only), is one component of a comprehensive MRSA colonization surveillance program. It is not intended to diagnose MRSA infection nor to guide or monitor treatment for MRSA infections. Performed at [facility], [place] [address], [place], [place] [address]      Labs: BNP (last 3 results) Recent Labs    05/23/17 0505 05/24/17 0500 05/25/17 0507  BNP 835.6* 722.8* 3,532.9*   Basic Metabolic Panel: Recent Labs  Lab 02/12/18 0746 02/13/18 0423 02/14/18 0348 02/15/18 0338  NA 142 144 145 146*  K 4.8 4.5 4.3 4.4  CL 110 113* 114* 114*  CO2 25 22 20* 18*  GLUCOSE 93 84 96 75  BUN 42* 35* 32* 34*  CREATININE 1.65*  1.28* 1.27* 1.46*  CALCIUM 8.9 8.8* 9.0 9.2  MG  --   --  1.9  --    Liver Function Tests: Recent Labs  Lab 02/12/18 0746 02/13/18 0423  AST 15 11*  ALT 5* 6*  ALKPHOS 58 56  BILITOT 0.3 0.4  PROT 6.3* 5.9*  ALBUMIN 2.5* 2.2*   No results for input(s): LIPASE, AMYLASE in the last 168 hours. No results for input(s): AMMONIA in the last 168 hours. CBC: Recent Labs  Lab 02/12/18 0746 02/13/18 0423  WBC 4.8 4.3  NEUTROABS 3.5  --   HGB 9.4* 8.6*  HCT 30.1* 26.6*  MCV 97.4 97.1  PLT 234 240   Cardiac Enzymes: No results for input(s): CKTOTAL, CKMB, CKMBINDEX, TROPONINI in the last 168 hours. BNP: Invalid input(s): POCBNP CBG: Recent Labs  Lab 02/12/18 0734 02/12/18 1249  GLUCAP 92 96   D-Dimer No results for input(s): DDIMER in the last 72 hours. Hgb A1c No results for input(s): HGBA1C in the last 72 hours. Lipid Profile No results for input(s): CHOL, HDL, LDLCALC, TRIG, CHOLHDL, LDLDIRECT in the last 72 hours. Thyroid function studies No results for input(s): TSH, T4TOTAL, T3FREE, THYROIDAB in the last 72 hours.  Invalid input(s): FREET3 Anemia work up No results for input(s): VITAMINB12, FOLATE, FERRITIN, TIBC, IRON, RETICCTPCT in the last 72 hours. Urinalysis    Component Value Date/Time   COLORURINE YELLOW 02/12/2018 1007   APPEARANCEUR CLEAR 02/12/2018 1007   LABSPEC 1.011 02/12/2018 1007   PHURINE 6.0 02/12/2018 1007   GLUCOSEU NEGATIVE 02/12/2018 1007   HGBUR NEGATIVE 02/12/2018 1007   BILIRUBINUR NEGATIVE 02/12/2018 1007   KETONESUR NEGATIVE 02/12/2018 1007   PROTEINUR 30 (A) 02/12/2018 1007   UROBILINOGEN 0.2 03/26/2009 1258   NITRITE NEGATIVE 02/12/2018 1007   LEUKOCYTESUR NEGATIVE 02/12/2018 1007   Sepsis Labs Invalid input(s): PROCALCITONIN,  WBC,  LACTICIDVEN Microbiology Recent Results (from the past 240 hour(s))  MRSA PCR Screening     Status: None   Collection Time: 02/12/18  7:06 PM  Result Value Ref Range Status   MRSA by PCR  NEGATIVE NEGATIVE Final    Comment:        The GeneXpert MRSA Assay (FDA approved for NASAL specimens only), is one component of a comprehensive MRSA colonization surveillance program. It is not intended to diagnose MRSA infection nor to guide or monitor treatment for MRSA infections. Performed at [facility], [place] [address] [place], [place] [address]      Time coordinating discharge: 35 minutes  SIGNED:  Chipper Oman, MD  [facility] 02/15/2018, 3:12 PM  Pager please text page via  [email or web address]  Note - This record has been created using [employer]. Chart creation errors have been sought, but may not always have been located. Such creation errors do not reflect on the standard of medical care.

## 2018-02-15 NOTE — NC FL2 (Signed)
[place] LEVEL OF CARE SCREENING TOOL     IDENTIFICATION  Patient Name: Elizabeth Daniels Birthdate: 10/11/1931 Sex: female Admission Date (Current Location): 02/12/2018  [facility] and [place] Number:  Herbalist and Address:  [facility],  [place], [place]      Provider Number: [id number]  Attending Physician Name and Address:  Patrecia Pour, Christean Grief, MD  Relative Name and Phone Number:       Current Level of Care: Hospital Recommended Level of Care: [place] Prior Approval Number:    Date Approved/Denied:   PASRR Number: [id number]  Discharge Plan: SNF    Current Diagnoses: Patient Active Problem List   Diagnosis Date Noted  . Seizure ([place]) 02/12/2018  . Altered mental status   . Above knee amputation status, right ([place])   . Pressure injury of skin 05/23/2017  . Aspiration pneumonia ([place]) 05/22/2017  . Gastrointestinal hemorrhage with melena   . Malnutrition of moderate degree 10/02/2015  . HTN (hypertension) 10/02/2015  . Syncope 10/02/2015  . Acute blood loss anemia 10/02/2015  . Acute kidney injury superimposed on CKD ([place]) 10/02/2015  . GERD (gastroesophageal reflux disease) 10/02/2015  . Glaucoma 10/02/2015  . Gout 10/02/2015  . PVD (peripheral vascular disease) ([place]) 10/02/2015  . Dysphagia 10/02/2015  . Generalized anxiety disorder 10/02/2015  . CHF (congestive heart failure) ([place]) 10/02/2015  . Hematochezia   . Abnormal CT scan, colon   . GI bleed 09/30/2015    Orientation RESPIRATION BLADDER Height & Weight     Self  Normal Incontinent Weight: 107 lb 4.8 oz (48.7 kg) Height:  5\' 2"  (157.5 cm)  BEHAVIORAL SYMPTOMS/MOOD NEUROLOGICAL BOWEL NUTRITION STATUS      Incontinent Diet(dysphasia I, thin fluid consistency)  AMBULATORY STATUS COMMUNICATION OF NEEDS Skin   Total Care Verbally PU Stage and Appropriate Care(pressure injury stage III, sacrum, foam dressings changes)                        Personal Care Assistance Level of Assistance  Bathing, Feeding, Dressing Bathing Assistance: Maximum assistance Feeding assistance: Maximum assistance Dressing Assistance: Maximum assistance     Functional Limitations Info  Sight, Hearing, Speech Sight Info: Adequate Hearing Info: Adequate Speech Info: Adequate    SPECIAL CARE FACTORS FREQUENCY                       Contractures Contractures Info: Not present    Additional Factors Info  Code Status, Allergies Code Status Info: DNR Allergies Info: Quinine Derivatives, Codeine           Current Medications (02/15/2018):  This is the current hospital active medication list Current Facility-Administered Medications  Medication Dose Route Frequency Provider Last Rate Last Dose  . acetaminophen (TYLENOL) tablet 1,000 mg  1,000 mg Oral Once Carmin Muskrat, MD   Stopped at 02/12/18 1326  . allopurinol (ZYLOPRIM) tablet 50 mg  50 mg Oral Daily Irene Pap N, DO   50 mg at 02/15/18 1015  . amLODipine (NORVASC) tablet 10 mg  10 mg Oral Daily Patrecia Pour, Christean Grief, MD   10 mg at 02/15/18 1015  . busPIRone (BUSPAR) tablet 10 mg  10 mg Oral Daily Irene Pap N, DO   10 mg at 02/15/18 1015  . busPIRone (BUSPAR) tablet 5 mg  5 mg Oral QHS Irene Pap N, DO   5 mg at 02/14/18 2218  . cholecalciferol (VITAMIN D) tablet  1,000 Units  1,000 Units Oral Daily Irene Pap N, DO   1,000 Units at 02/15/18 1015  . cloNIDine (CATAPRES - Dosed in mg/24 hr) patch 0.2 mg  0.2 mg Transdermal Weekly Patrecia Pour, Christean Grief, MD   0.2 mg at 02/14/18 1627  . clopidogrel (PLAVIX) tablet 75 mg  75 mg Oral Daily Irene Pap N, DO   75 mg at 02/15/18 1015  . donepezil (ARICEPT) tablet 10 mg  10 mg Oral QHS Hall, Carole N, DO   10 mg at 02/14/18 2218  . feeding supplement (BOOST / RESOURCE BREEZE) liquid 1 Container  1 Container Oral TID WC Patrecia Pour, Christean Grief, MD   1 Container at 02/15/18 1230  . ferrous sulfate tablet 325 mg  325 mg  Oral BID WC Hall, Carole N, DO   325 mg at 02/15/18 1015  . gabapentin (NEURONTIN) capsule 300 mg  300 mg Oral BID Irene Pap N, DO   300 mg at 02/15/18 1015  . haloperidol lactate (HALDOL) injection 1 mg  1 mg Intravenous Q6H PRN Patrecia Pour, Christean Grief, MD   1 mg at 02/14/18 1732  . heparin injection 5,000 Units  5,000 Units Subcutaneous [address] DO   5,000 Units at 02/15/18 8315  . hydrALAZINE (APRESOLINE) injection 10 mg  10 mg Intravenous Q6H PRN Patrecia Pour, Christean Grief, MD   10 mg at 02/14/18 1710  . hydrALAZINE (APRESOLINE) tablet 100 mg  100 mg Oral TID Patrecia Pour, Christean Grief, MD   100 mg at 02/15/18 1015  . HYDROcodone-acetaminophen (NORCO/VICODIN) 5-325 MG per tablet 1-2 tablet  1-2 tablet Oral Q6H PRN Vertis Kelch, NP   2 tablet at 02/14/18 0825  . labetalol (NORMODYNE) tablet 300 mg  300 mg Oral TID Irene Pap N, DO   300 mg at 02/15/18 1015  . labetalol (NORMODYNE,TRANDATE) injection 10 mg  10 mg Intravenous Q2H PRN Patrecia Pour, Christean Grief, MD   10 mg at 02/14/18 1648  . levETIRAcetam (KEPPRA) IVPB 500 mg/100 mL premix  500 mg Intravenous Q12H Carmin Muskrat, MD   Stopped at 02/15/18 [phone]  . nutrition supplement (JUVEN) (JUVEN) powder packet 1 packet  1 packet Oral BID BM Patrecia Pour, Christean Grief, MD   1 packet at 02/15/18 1354  . pantoprazole (PROTONIX) EC tablet 40 mg  40 mg Oral Daily Irene Pap N, DO   40 mg at 02/15/18 1015  . polyethylene glycol (MIRALAX / GLYCOLAX) packet 17 g  17 g Oral Daily Patrecia Pour, Christean Grief, MD   17 g at 02/15/18 1015  . risperiDONE (RISPERDAL) tablet 0.5 mg  0.5 mg Oral BID Patrecia Pour, Christean Grief, MD   0.5 mg at 02/15/18 1015  . senna-docusate (Senokot-S) tablet 1 tablet  1 tablet Oral BID Irene Pap N, DO   1 tablet at 02/15/18 1015  . sodium bicarbonate tablet 650 mg  650 mg Oral BID Irene Pap N, DO   650 mg at 02/15/18 1015  . sodium chloride flush (NS) 0.9 % injection 10-40 mL  10-40 mL Intracatheter PRN Doreatha Lew, MD          Discharge Medications: Please see discharge summary for a list of discharge medications.  Relevant Imaging Results:  Relevant Lab Results:   Additional Information SS # [id number]  Nila Nephew, LCSW

## 2018-04-22 ENCOUNTER — Ambulatory Visit: Payer: Self-pay | Admitting: Neurology

## 2018-05-27 DEATH — deceased

## 2018-09-10 IMAGING — CT CT HEAD W/O CM
3 series · 14 of 47 positions shown, 16 images · non-contrast
Comparison: 05/22/2017

CLINICAL DATA: Altered level of consciousness

EXAM:
CT HEAD WITHOUT CONTRAST
TECHNIQUE: Contiguous axial images were obtained from the base of the skull
through the vertex without intravenous contrast.

[Series 2: head wo · axial · 0.47mm/px · z∈[-120,+10]mm · 8 of 32 slices shown, 10 images]
[im 3/32  brain]
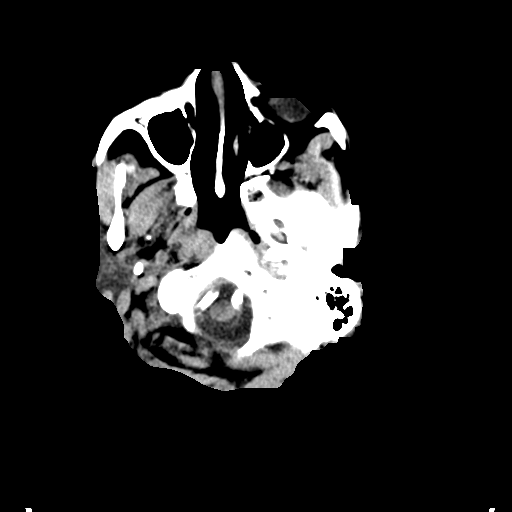
[im 3/32  bone]
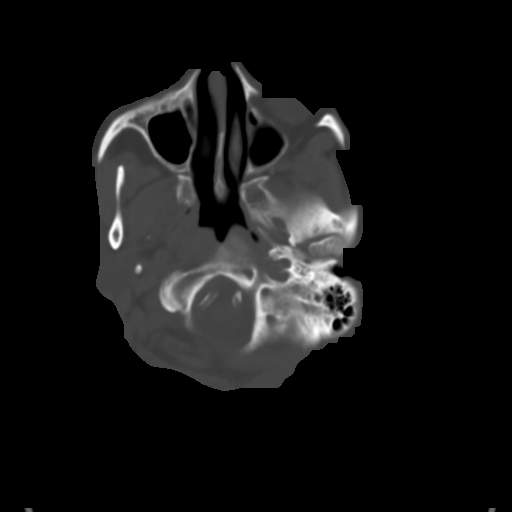
[im 7/32  brain]
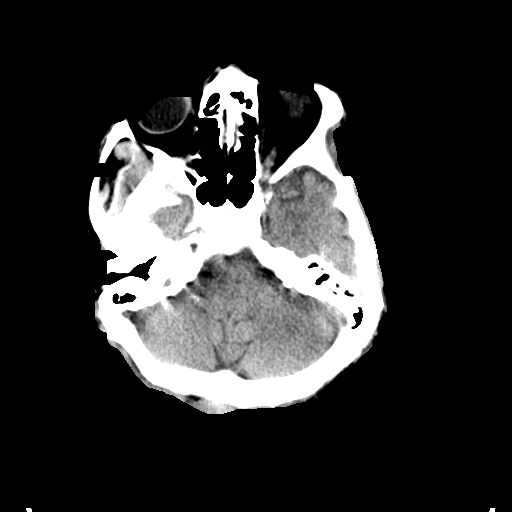
[im 10/32  brain]
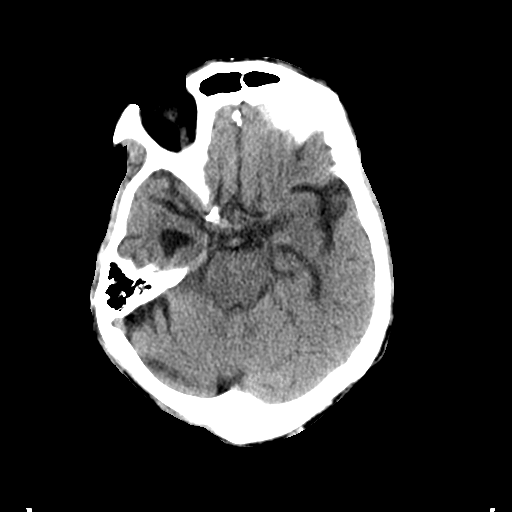
[im 14/32  brain]
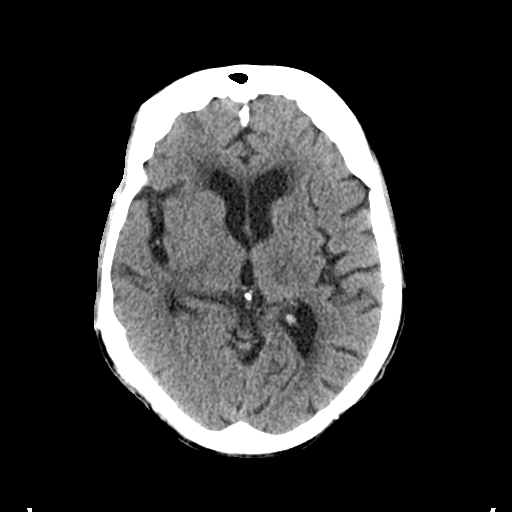
[im 18/32  brain]
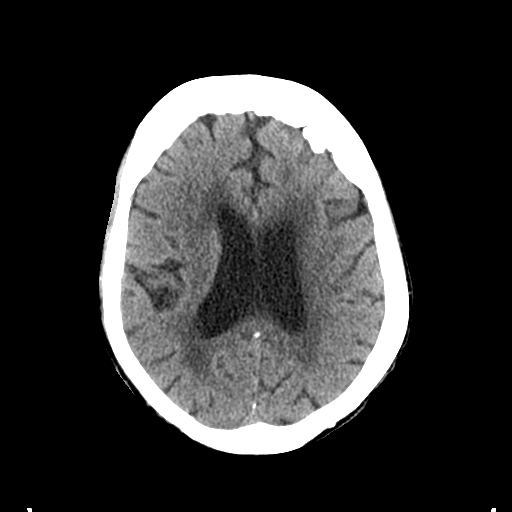
[im 18/32  bone]
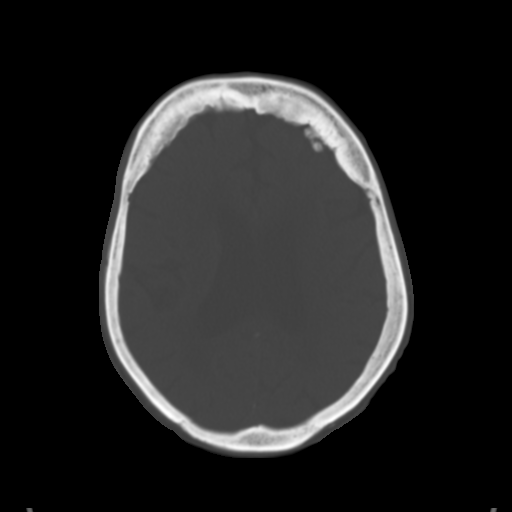
[im 22/32  brain]
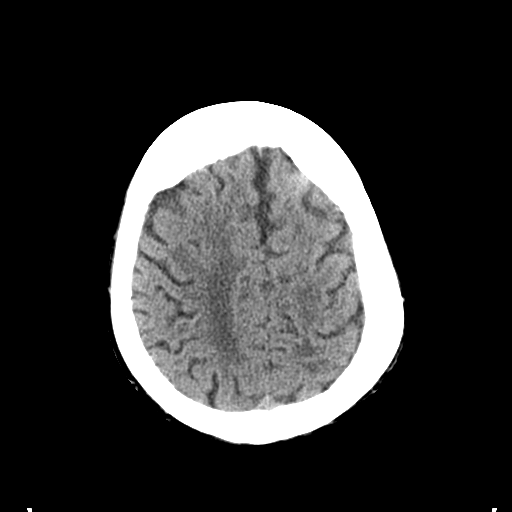
[im 25/32  brain]
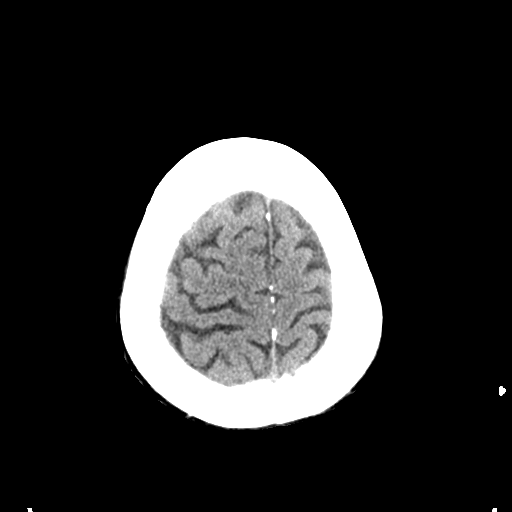
[im 29/32  brain]
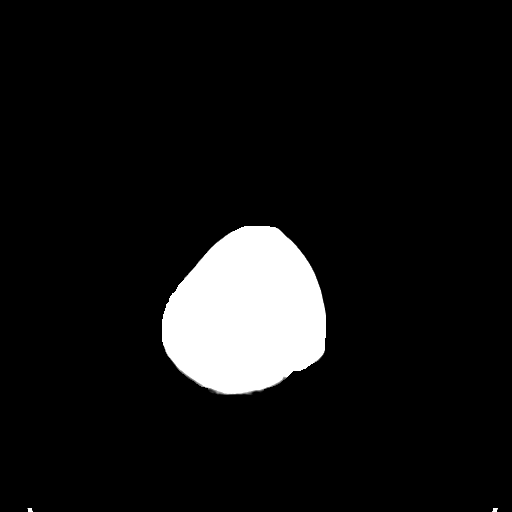

[Series 4: coronal soft tissue · coronal · 0.30mm/px · 3 of 78 slices shown]
[im 26/78  brain]
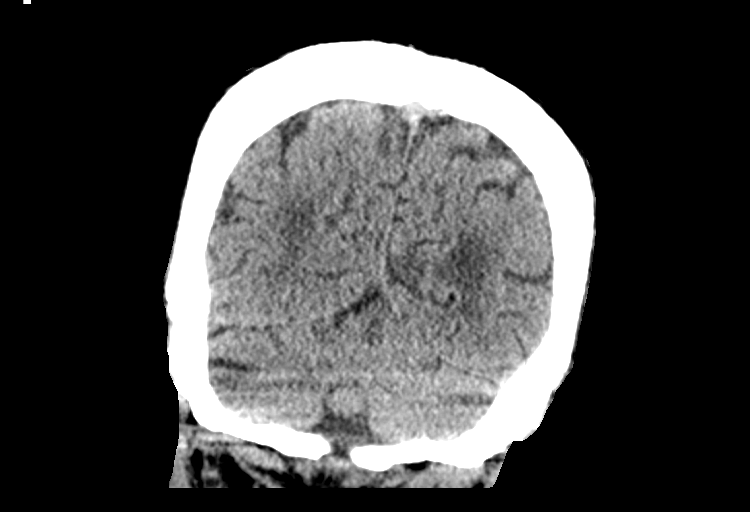
[im 35/78  brain]
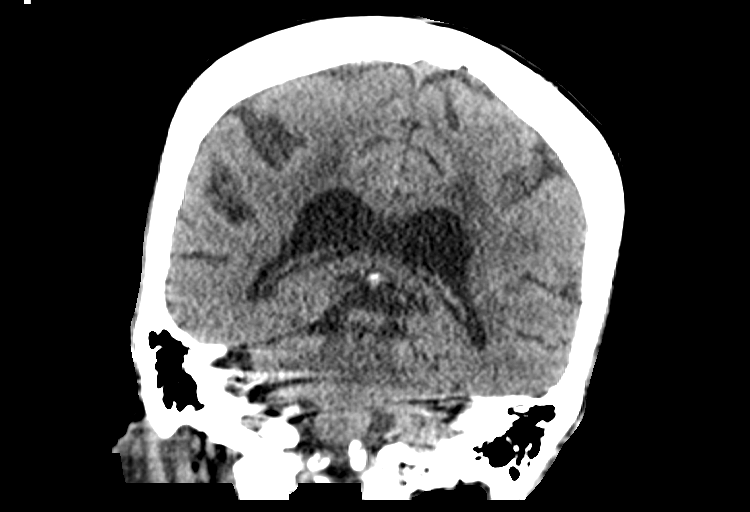
[im 43/78  brain]
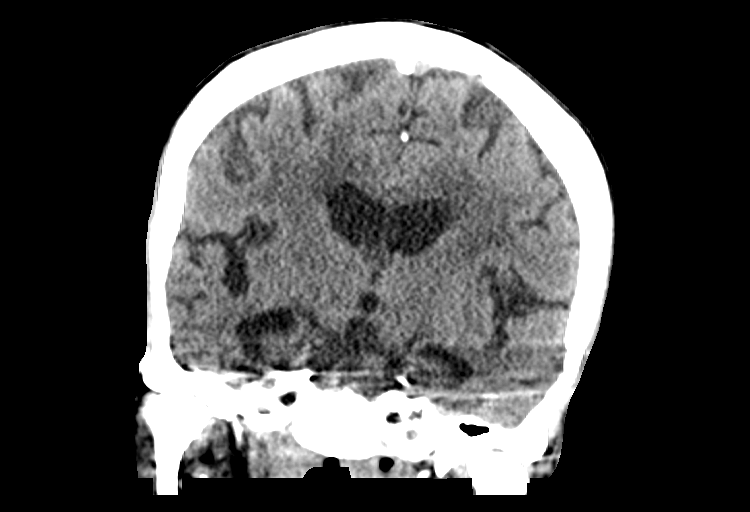

[Series 5: sagittal soft tissue · sagittal · 0.30mm/px · 3 of 72 slices shown]
[im 24/72  brain]
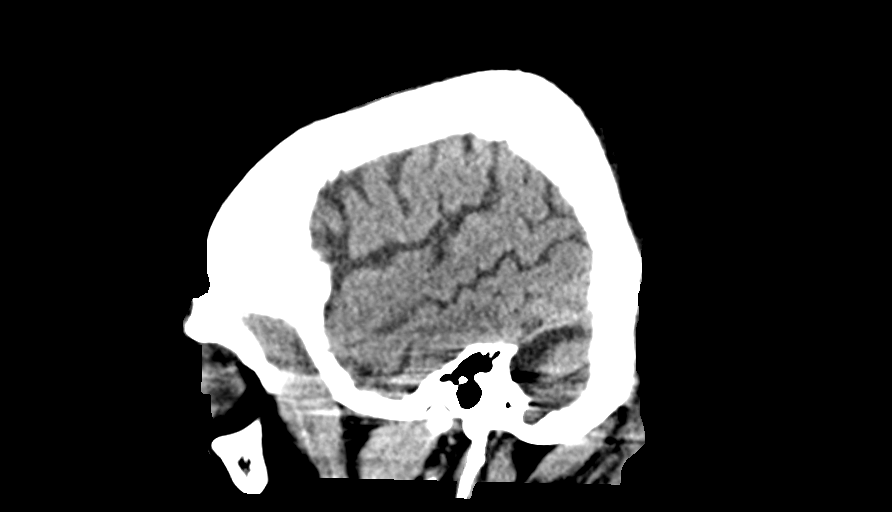
[im 36/72  brain]
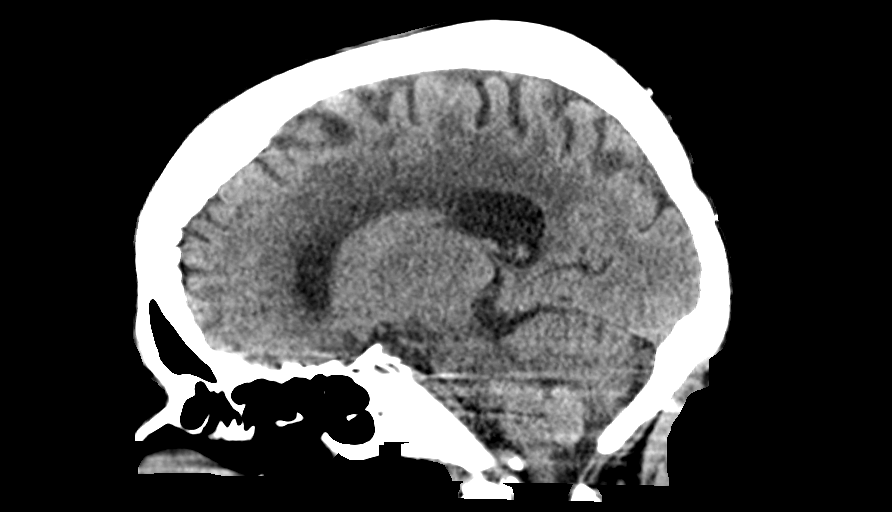
[im 48/72  brain]
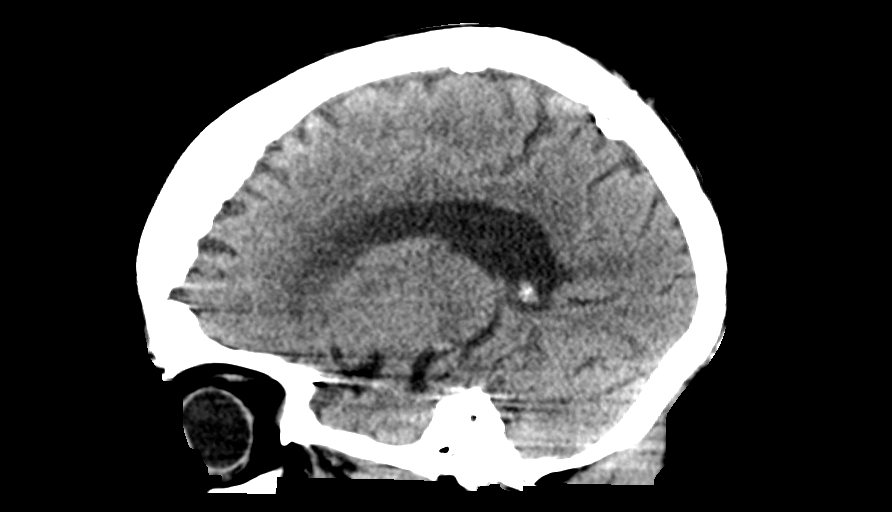

[14 of 47 positions shown; findings below may reference images not displayed]

FINDINGS: Brain: There is atrophy and chronic small vessel disease changes. No
acute intracranial abnormality. Specifically, no hemorrhage,
hydrocephalus, mass lesion, acute infarction, or significant
intracranial injury.

Vascular: No hyperdense vessel or unexpected calcification.

Skull: No acute calvarial abnormality.

Sinuses/Orbits: Visualized paranasal sinuses and mastoids clear.
Orbital soft tissues unremarkable.

Other: None
IMPRESSION: No acute intracranial abnormality.

Atrophy, chronic microvascular disease.

## 2018-09-12 IMAGING — DX DG SACRUM/COCCYX 2+V
3 series · 4 of 4 positions shown · non-contrast
Comparison: CT abdomen and pelvis 09/30/2015.

CLINICAL DATA: Sacral and coccygeal pain.  No injury.

EXAM:
SACRUM AND COCCYX - 2+ VIEW

[Series 1: coccyx ap · 0.14mm/px · 2 of 2 slices shown]
[im 1/2]
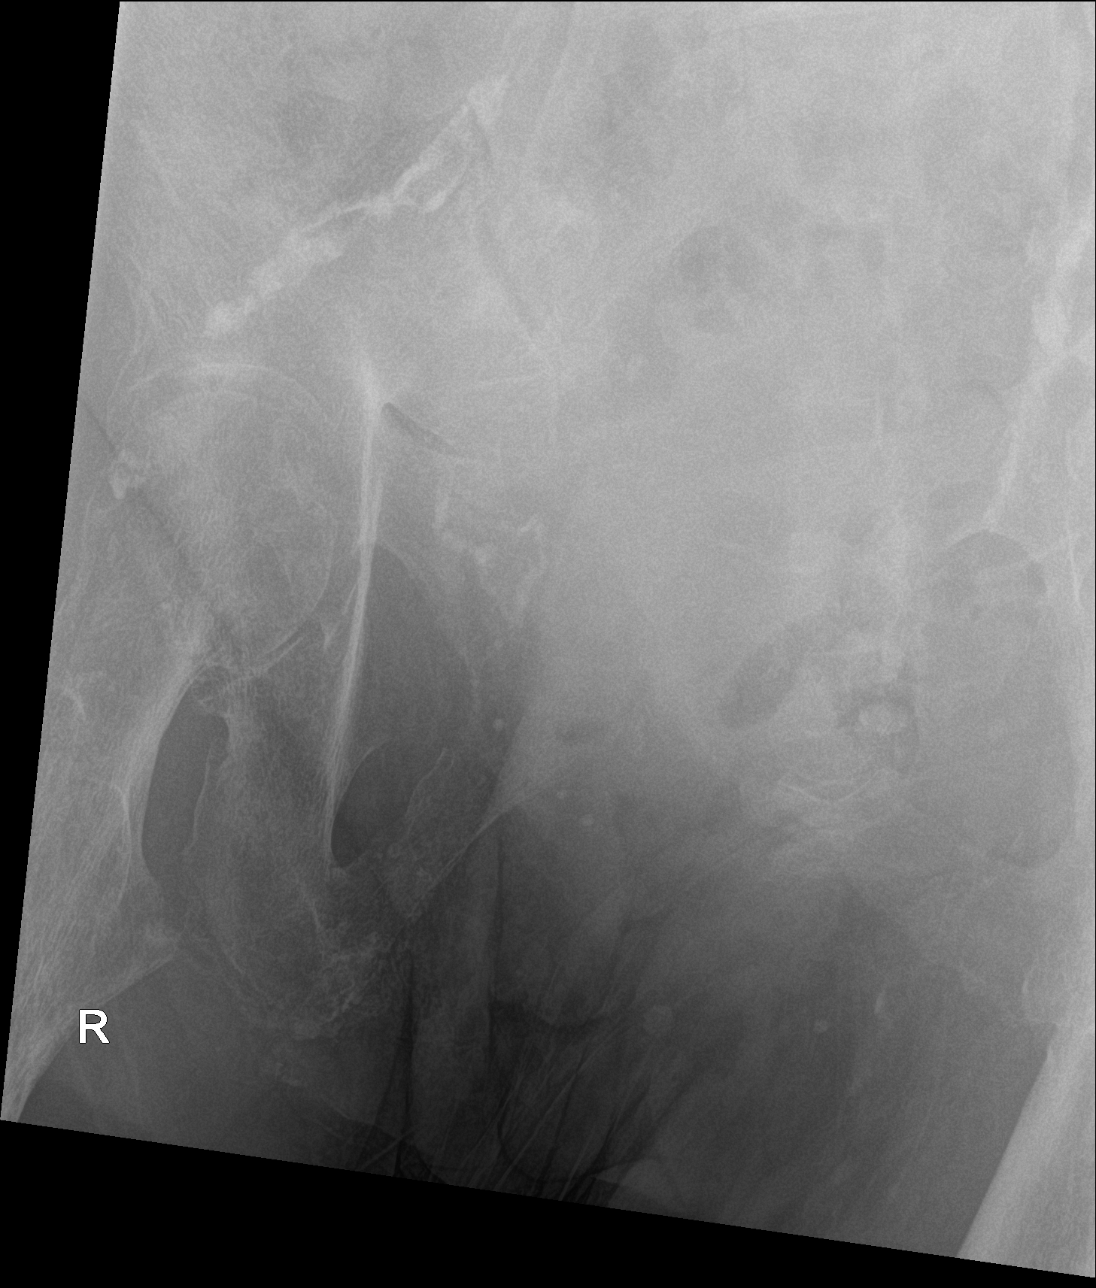
[im 2/2]
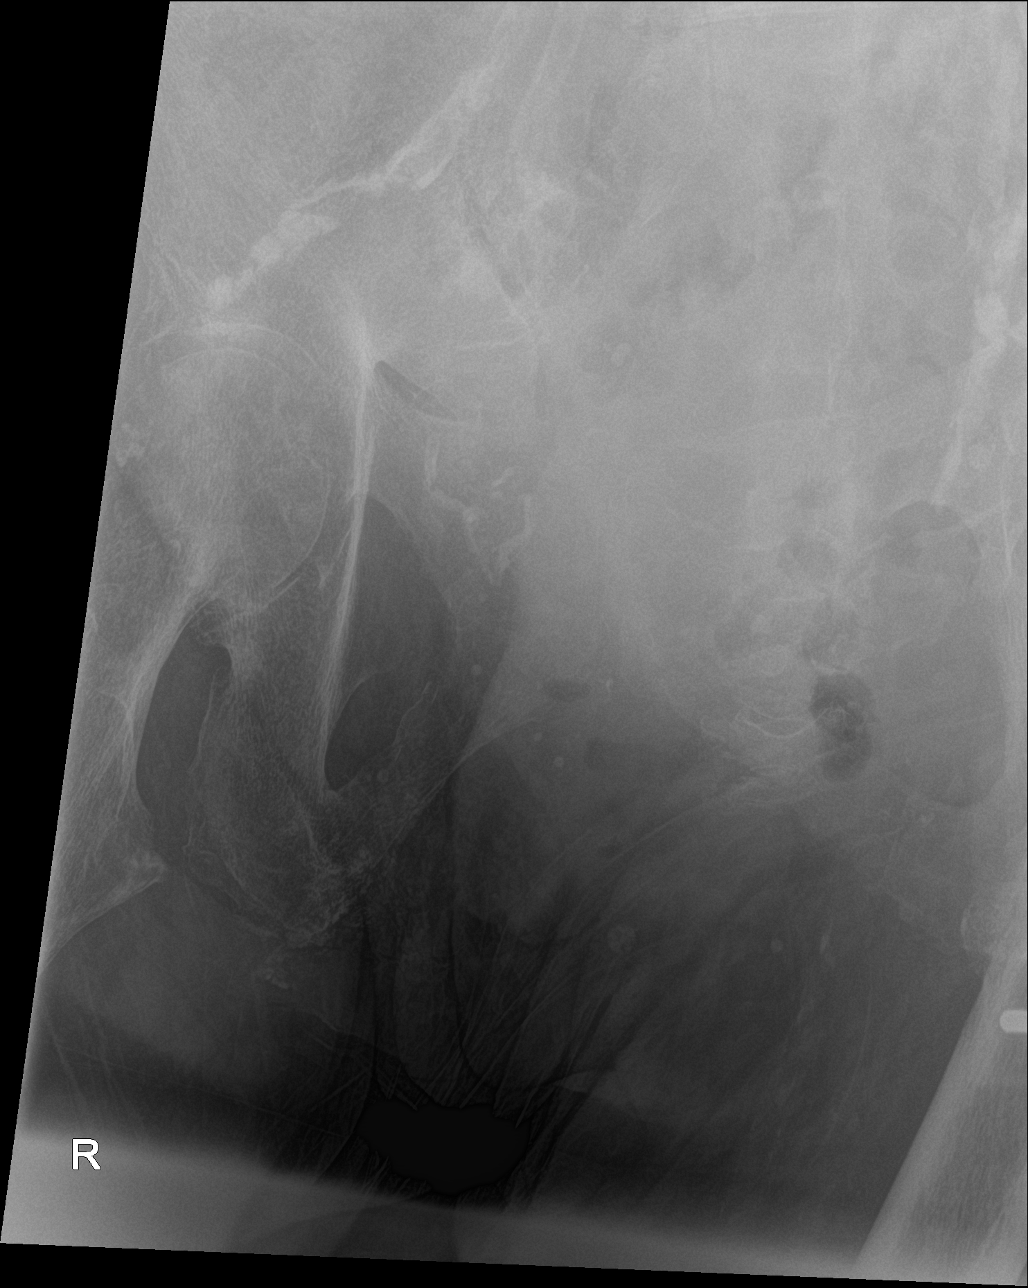

[sacrum ap]
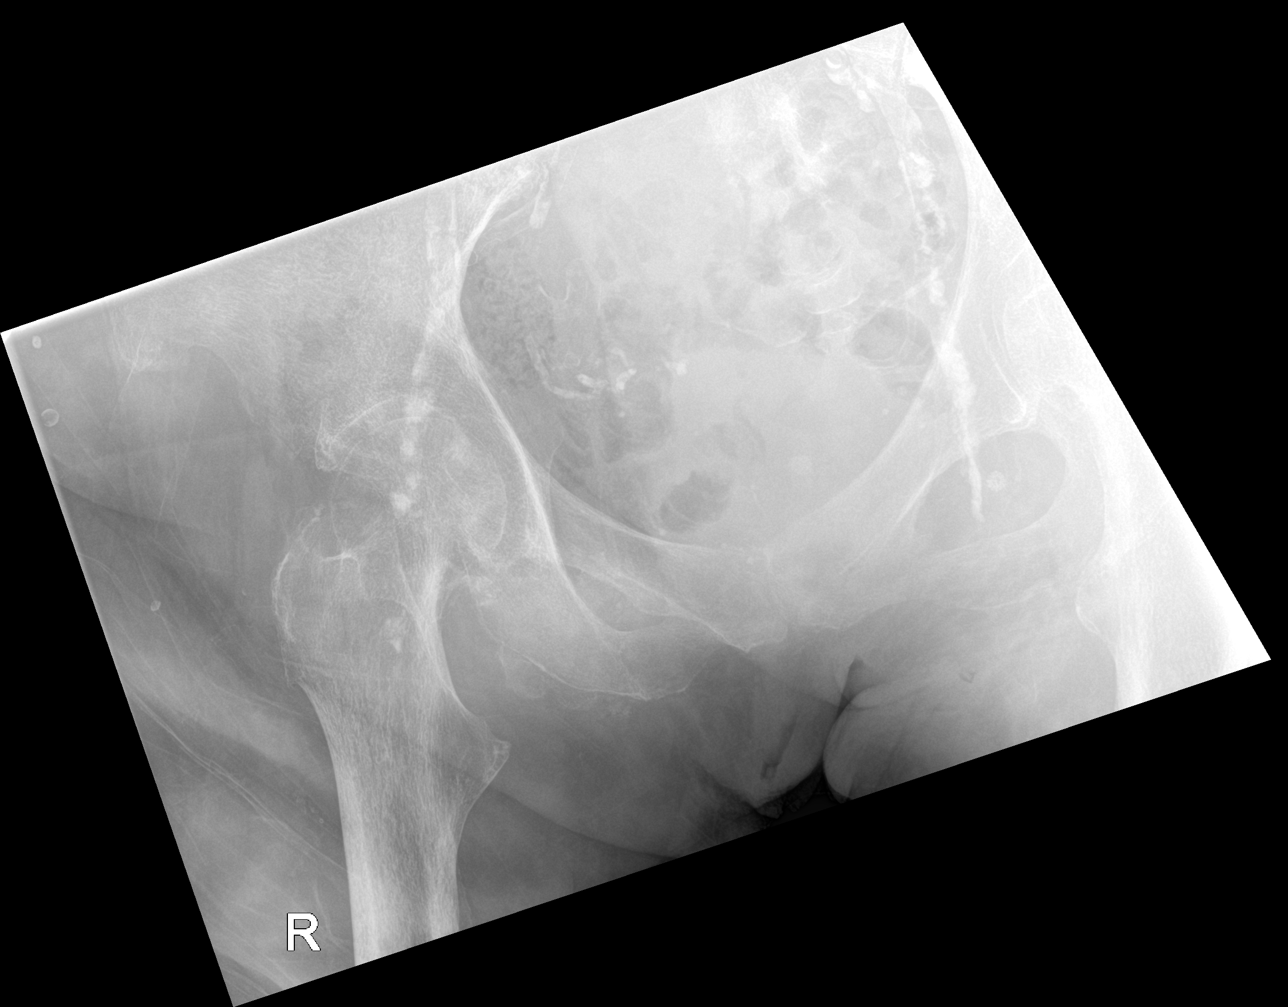

[sacrum lat]
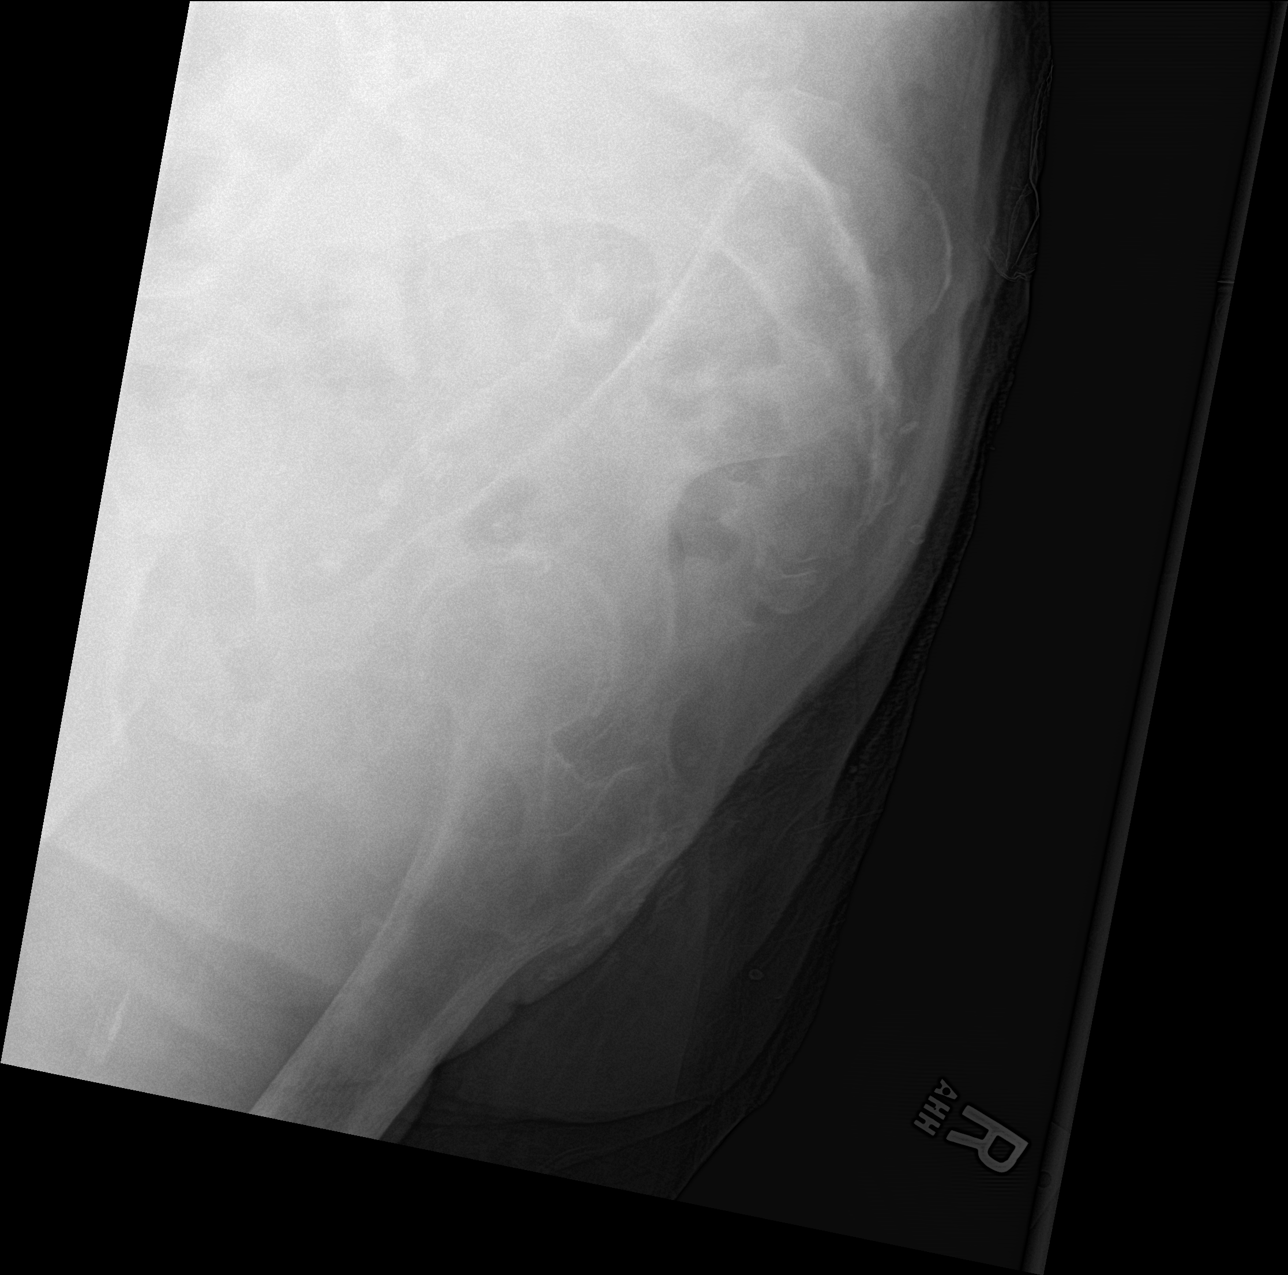

[4 of 4 positions shown; findings below may reference images not displayed]

FINDINGS: There is no evidence of fracture or other focal bone lesions.
IMPRESSION: Negative exam.
